# Patient Record
Sex: Male | Born: 1960 | Race: White | Hispanic: No | Marital: Single
Health system: Midwestern US, Academic
[De-identification: ages and names within clinical notes are randomized; demographics above are authoritative.]

---

## 2016-05-04 MED ORDER — PEG-ELECTROLYTE SOLN 420 GRAM PO SOLR
4 L | Freq: Once | ORAL | 0 refills | Status: AC
Start: 2016-05-04 — End: ?

## 2016-05-14 MED ORDER — LACTATED RINGERS IV SOLP
0 refills | Status: DC
Start: 2016-05-14 — End: 2016-05-14

## 2016-05-14 MED ORDER — PROPOFOL 10 MG/ML IV EMUL 20 ML (INFUSION)(AM)(OR)
INTRAVENOUS | 0 refills | Status: DC
Start: 2016-05-14 — End: 2016-05-14

## 2016-05-14 MED ORDER — FENTANYL CITRATE (PF) 50 MCG/ML IJ SOLN
50 ug | INTRAVENOUS | 0 refills | Status: CN | PRN
Start: 2016-05-14 — End: ?

## 2016-05-14 MED ORDER — LACTATED RINGERS IV SOLP
1000 mL | INTRAVENOUS | 0 refills | Status: DC
Start: 2016-05-14 — End: 2016-05-14

## 2016-05-14 MED ORDER — PROPOFOL INJ 10 MG/ML IV VIAL
0 refills | Status: DC
Start: 2016-05-14 — End: 2016-05-14

## 2016-05-14 MED ORDER — ONDANSETRON 8 MG PO TBDI
8 mg | Freq: Once | ORAL | 0 refills | Status: CN | PRN
Start: 2016-05-14 — End: ?

## 2016-05-14 MED ORDER — MIDAZOLAM 1 MG/ML IJ SOLN
INTRAVENOUS | 0 refills | Status: DC
Start: 2016-05-14 — End: 2016-05-14

## 2016-05-20 MED ORDER — SODIUM CHLORIDE 0.9 % IV SOLP
INTRAVENOUS | 0 refills | Status: CN
Start: 2016-05-20 — End: ?

## 2016-06-02 MED ORDER — SODIUM CHLORIDE 0.9 % IV SOLP
INTRAVENOUS | 0 refills | Status: CN
Start: 2016-06-02 — End: ?

## 2016-06-04 MED ORDER — PROPOFOL 10 MG/ML IV EMUL 20 ML (INFUSION)(AM)(OR)
INTRAVENOUS | 0 refills | Status: DC
Start: 2016-06-04 — End: 2016-06-04

## 2016-06-04 MED ORDER — LACTATED RINGERS IV SOLP
500 mL | INTRAVENOUS | 0 refills | Status: DC
Start: 2016-06-04 — End: 2016-06-04

## 2016-06-04 MED ORDER — LIDOCAINE (PF) 200 MG/10 ML (2 %) IJ SYRG
0 refills | Status: DC
Start: 2016-06-04 — End: 2016-06-04

## 2016-06-04 MED ORDER — KETAMINE 10 MG/ML IJ SOLN
0 refills | Status: DC
Start: 2016-06-04 — End: 2016-06-04

## 2016-06-04 MED ORDER — PROPOFOL INJ 10 MG/ML IV VIAL
0 refills | Status: DC
Start: 2016-06-04 — End: 2016-06-04

## 2016-09-23 ENCOUNTER — Encounter: Admit: 2016-09-23 | Discharge: 2016-09-23 | Payer: BC Managed Care – HMO

## 2016-09-23 ENCOUNTER — Ambulatory Visit: Admit: 2016-09-23 | Discharge: 2016-09-23 | Payer: BC Managed Care – HMO

## 2016-09-23 ENCOUNTER — Ambulatory Visit: Admit: 2016-09-23 | Discharge: 2016-09-24 | Payer: BC Managed Care – HMO

## 2016-09-23 DIAGNOSIS — R918 Other nonspecific abnormal finding of lung field: Principal | ICD-10-CM

## 2016-09-23 DIAGNOSIS — E042 Nontoxic multinodular goiter: ICD-10-CM

## 2016-09-23 DIAGNOSIS — C73 Malignant neoplasm of thyroid gland: Principal | ICD-10-CM

## 2016-09-23 DIAGNOSIS — Z87891 Personal history of nicotine dependence: ICD-10-CM

## 2016-09-23 DIAGNOSIS — D499 Neoplasm of unspecified behavior of unspecified site: ICD-10-CM

## 2016-09-23 DIAGNOSIS — I10 Essential (primary) hypertension: Secondary | ICD-10-CM

## 2016-09-23 DIAGNOSIS — R079 Chest pain, unspecified: ICD-10-CM

## 2016-09-23 DIAGNOSIS — H9319 Tinnitus, unspecified ear: ICD-10-CM

## 2016-09-23 DIAGNOSIS — E349 Endocrine disorder, unspecified: ICD-10-CM

## 2016-09-23 DIAGNOSIS — R938 Abnormal findings on diagnostic imaging of other specified body structures: Principal | ICD-10-CM

## 2016-09-23 DIAGNOSIS — Z923 Personal history of irradiation: ICD-10-CM

## 2016-09-23 DIAGNOSIS — F419 Anxiety disorder, unspecified: ICD-10-CM

## 2016-09-23 DIAGNOSIS — F1020 Alcohol dependence, uncomplicated: ICD-10-CM

## 2016-11-11 ENCOUNTER — Ambulatory Visit: Admit: 2016-11-11 | Discharge: 2016-11-11 | Payer: BC Managed Care – HMO

## 2016-11-11 ENCOUNTER — Encounter: Admit: 2016-11-11 | Discharge: 2016-11-11 | Payer: BC Managed Care – HMO

## 2016-11-11 DIAGNOSIS — R079 Chest pain, unspecified: ICD-10-CM

## 2016-11-11 DIAGNOSIS — C73 Malignant neoplasm of thyroid gland: Principal | ICD-10-CM

## 2016-11-11 DIAGNOSIS — E349 Endocrine disorder, unspecified: ICD-10-CM

## 2016-11-11 DIAGNOSIS — F1020 Alcohol dependence, uncomplicated: ICD-10-CM

## 2016-11-11 DIAGNOSIS — R110 Nausea: Principal | ICD-10-CM

## 2016-11-11 DIAGNOSIS — Z923 Personal history of irradiation: ICD-10-CM

## 2016-11-11 DIAGNOSIS — H9319 Tinnitus, unspecified ear: ICD-10-CM

## 2016-11-11 DIAGNOSIS — D499 Neoplasm of unspecified behavior of unspecified site: ICD-10-CM

## 2016-11-11 DIAGNOSIS — E042 Nontoxic multinodular goiter: ICD-10-CM

## 2016-11-11 DIAGNOSIS — I10 Essential (primary) hypertension: Secondary | ICD-10-CM

## 2016-11-11 DIAGNOSIS — F419 Anxiety disorder, unspecified: ICD-10-CM

## 2016-11-11 MED ORDER — NORTRIPTYLINE 25 MG PO CAP
25 mg | ORAL_CAPSULE | Freq: Every evening | ORAL | 5 refills | Status: AC
Start: 2016-11-11 — End: 2017-02-12

## 2016-11-13 ENCOUNTER — Ambulatory Visit: Admit: 2016-11-13 | Discharge: 2016-11-14 | Payer: BC Managed Care – HMO

## 2016-11-13 ENCOUNTER — Encounter: Admit: 2016-11-13 | Discharge: 2016-11-13 | Payer: BC Managed Care – HMO

## 2016-11-13 ENCOUNTER — Ambulatory Visit: Admit: 2016-11-13 | Discharge: 2016-11-13 | Payer: BC Managed Care – HMO

## 2016-11-13 DIAGNOSIS — E349 Endocrine disorder, unspecified: ICD-10-CM

## 2016-11-13 DIAGNOSIS — E785 Hyperlipidemia, unspecified: ICD-10-CM

## 2016-11-13 DIAGNOSIS — I10 Essential (primary) hypertension: ICD-10-CM

## 2016-11-13 DIAGNOSIS — F419 Anxiety disorder, unspecified: ICD-10-CM

## 2016-11-13 DIAGNOSIS — F1020 Alcohol dependence, uncomplicated: ICD-10-CM

## 2016-11-13 DIAGNOSIS — Z923 Personal history of irradiation: ICD-10-CM

## 2016-11-13 DIAGNOSIS — H9319 Tinnitus, unspecified ear: ICD-10-CM

## 2016-11-13 DIAGNOSIS — R918 Other nonspecific abnormal finding of lung field: ICD-10-CM

## 2016-11-13 DIAGNOSIS — C73 Malignant neoplasm of thyroid gland: Principal | ICD-10-CM

## 2016-11-13 DIAGNOSIS — R972 Elevated prostate specific antigen [PSA]: ICD-10-CM

## 2016-11-13 DIAGNOSIS — D499 Neoplasm of unspecified behavior of unspecified site: ICD-10-CM

## 2016-11-13 DIAGNOSIS — E890 Postprocedural hypothyroidism: ICD-10-CM

## 2016-11-13 DIAGNOSIS — Z720 Tobacco use: ICD-10-CM

## 2016-11-13 DIAGNOSIS — E042 Nontoxic multinodular goiter: ICD-10-CM

## 2016-11-13 DIAGNOSIS — R079 Chest pain, unspecified: ICD-10-CM

## 2016-11-13 LAB — COMPREHENSIVE METABOLIC PANEL
Lab: 1 mg/dL (ref 0.4–1.24)
Lab: 122 mg/dL — ABNORMAL HIGH (ref 70–100)
Lab: 137 MMOL/L (ref 137–147)
Lab: 18 mg/dL (ref 7–25)
Lab: 3.6 MMOL/L (ref 3.5–5.1)
Lab: 97 MMOL/L — ABNORMAL LOW (ref 98–110)

## 2016-11-13 LAB — PSA SCREEN: Lab: 0.8 ng/mL (ref <2.01)

## 2016-11-13 LAB — TSH WITH FREE T4 REFLEX: Lab: 2.8 uU/mL (ref 0.35–5.00)

## 2016-11-13 MED ORDER — TRAZODONE 50 MG PO TAB
50-100 mg | ORAL_TABLET | Freq: Every evening | ORAL | 3 refills | Status: AC
Start: 2016-11-13 — End: 2017-02-12

## 2016-11-13 MED ORDER — CHLORTHALIDONE 25 MG PO TAB
25 mg | ORAL_TABLET | Freq: Every day | ORAL | 3 refills | Status: AC
Start: 2016-11-13 — End: 2018-04-14

## 2016-11-13 MED ORDER — ATENOLOL 25 MG PO TAB
25 mg | ORAL_TABLET | Freq: Every day | ORAL | 3 refills | 33.00000 days | Status: AC
Start: 2016-11-13 — End: 2017-11-18

## 2016-11-13 NOTE — Progress Notes
Date of Service: 11/13/2016    Subjective:             Jeffrey Sanders is a 56 y.o. male.      Hypertension   Pertinent negatives include no chest pain, palpitations or shortness of breath.      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 Royce Macadamia and HTN presented for regular follow-up.  He has been tapered off of the Restoril and started on trazodone which has been helping him with his sleep.  Blood work has been reviewed patient denied having any other acute concerns.  Seen recently by gastroenterology and has been started on nortriptyline which she has not started taking.  He is appearing to be anxious.  He is not taking the Restoril and a longer however he is still taking the trazodone for sleep.  He was seen by endocrinology externally.  He has been taking atenolol and chlorthalidone for hypertension however those were not given by the office here and was wanting a local doctor might be getting him.  Denied having any other acute concerns were reviewed the medical records.  History of Cowden's Dz (hamartoma syndrome), FAP - Familial Adenomatous Polyposis, or MEN syndrome: No  History of neck radiation exposure: No   Endocrinologist: Jeffrey Sanders  Was also seen by pulmonary and had a CT scan for pulmonary nodules and follow-up in 12 months has been recommended no bronchodilators have been started based on his symptoms and also the PFTs.  Patient voiced understanding of all the recommendations denied having any other acute concerns at this point of time.  Past Medical History:   Diagnosis Date   ??? Alcoholism (HCC)    ??? Anxiety    ??? Chest pain 03/26/2016   ??? High blood pressure    ??? Hypertension 03/26/2016   ??? Multiple thyroid nodules 12/27/2008   ??? Neoplasm of unspecified nature, site unspecified    ??? Papillary carcinoma of thyroid (HCC) 12/07   ??? Ringing in the ears 12/27/2008 ??? S/P radioactive iodine thyroid ablation    ??? Unspecified endocrine disorder      Past Surgical History:   Procedure Laterality Date   ??? COLONOSCOPY N/A 05/14/2016    COLONOSCOPY performed by Jeffrey Nine, MD at [facility]   ??? COLONOSCOPY  05/14/2016    COLONOSCOPY EXCISION LESION performed by Jeffrey Nine, MD at [facility]   ??? UPPER GASTROINTESTINAL ENDOSCOPY N/A 06/04/2016    ESOPHAGOGASTRODUODENOSCOPY performed by Jeffrey Hoist, DO at [facility]   ??? UPPER GASTROINTESTINAL ENDOSCOPY  06/04/2016    ESOPHAGOGASTRODUODENOSCOPY BIOPSY performed by Jeffrey Hoist, DO at [facility]   ??? HX THYROIDECTOMY     ??? THYROIDECTOMY       Social History   ??? Marital status: Single     Spouse name: N/A   ??? Number of children: N/A   ??? Years of education: N/A     Occupational History   ??? 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 ???He does have a history of smoking of 3 packs a day, he quit a few years ago. ???He currently chews tobacco. Has a history of significant alcohol use, he used to drink 14 -15 beers a night. ???Patient tells me that currently he drinks 8-10 beers every night.       Social History Main Topics   ??? Smoking status: Former Smoker  Packs/day: 1.00     Years: 26.00     Types: Cigarettes     Quit date: 01/13/2008   ??? Smokeless tobacco: Current User     Types: Chew      Comment: Counseling given 1.31.13   ??? Alcohol use No   ??? Drug use: No   ??? Sexual activity: Not on file     Social history:     Review of Systems   Constitutional: Negative for activity change, appetite change, fatigue, fever and unexpected weight change.   HENT: Negative for dental problem, sore throat, tinnitus, trouble swallowing and voice change.    Eyes: Negative for visual disturbance.   Respiratory: Negative for shortness of breath.    Cardiovascular: Negative for chest pain, palpitations and leg swelling.   Gastrointestinal: Negative for abdominal pain.   Genitourinary: Negative for frequency.   Musculoskeletal: Negative for joint swelling. Neurological: Negative for dizziness.   Hematological: Negative for adenopathy.   Psychiatric/Behavioral: Negative for dysphoric mood and sleep disturbance. The patient is nervous/anxious.      Objective:         ??? aspirin 81 mg chewable tablet Chew 81 mg by mouth daily. Take with food.   ??? atenolol (TENORMIN) 25 mg tablet Take one tablet by mouth daily.   ??? atorvastatin (LIPITOR) 40 mg tablet Take 1 tablet by mouth daily.   ??? bi-est 50/50-progesterone-testosterone 1.25-5-0.125 mg/g topical cream Apply 1 g topically to affected area twice daily. *To inner thigh or upper inner arm   ??? chlorthalidone (HYGROTON) 25 mg tablet Take one tablet by mouth daily.   ??? levothyroxine (SYNTHROID) 100 mcg tablet Take 100 mcg by mouth daily 30 minutes before breakfast.   ??? nortriptyline (PAMELOR) 25 mg capsule Take one capsule by mouth at bedtime daily.   ??? omeprazole DR(+) (PRILOSEC) 20 mg capsule Take 20 mg by mouth daily before breakfast.   ??? traZODone (DESYREL) 50 mg tablet Take one tablet to two tablets by mouth at bedtime daily.     Vitals:    11/13/16 0935   BP: 132/83   Pulse: 75   Resp: 16   Temp: 36.7 ???C (98.1 ???F)   TempSrc: Oral   Weight: 72.8 kg (160 lb 9.6 oz)   Height: 170.2 cm (67)     Body mass index is 25.15 kg/m???.     Physical Exam  General Appearance: normal in appearance  Skin: warm, moist,   Eyes: conjunctivae mildly red and lids normal, pupils are equal and round  Lips & Oral Mucosa: no pallor  Neck Veins: neck veins are flat,  Respiratory Effort: breathing comfortably, no respiratory distress  Cardiac Rhythm: regular rhythm and normal rate  Lower Extremity Edema: no lower extremity edema  Abdominal Exam: soft, non-tender, no masses, bowel sounds normal  Neurologic Exam: neurological assessment grossly intact     Assessment and Plan:  History of T2, NX, MX papillary thyroid cancer ,left-followed closely by Endocrinology.  [place] FNA 06/2009 - No lesional tissue identified, Thyroglobulin AB <20. Non detectable last year Tgb recently 02/04/10. [place] L with stable2+cm remnant visible. Follwos with Zhou, [place] T and Tgb panel ,TSH lower normal recently.      Hyperlipidemia Management - On lipitor   Lab Results   Component Value Date/Time    CHOL 188 03/30/2016 10:25 AM    TRIG 172 (H) 03/30/2016 10:25 AM    HDL 41 03/30/2016 10:25 AM    LDL 123 (H) 03/30/2016 10:25 AM  VLDL 34 03/30/2016 10:25 AM    NONHDLCHOL 147 03/30/2016 10:25 AM    ALT 25 04/11/2016 11:12 AM    CK 41 03/19/2016        Imp: Hyperlipidemia fair     Plan:  Discussed labs and reviewed goals for LDL, HDL, triglycerides.   Discussed exercise management and diet with emphasis on vegetables, fruit and lean meat.  Are barriers to achieving goals present? No  Medication education provided. Patient voiced understanding? Yes  Patient able to self-manage and ready to comply?Yes  Educational resources identified? Verbal Counseling     Pulmonary nodules likely nonmetastatic however will continue to monitor in 12 months  - PPSV 23 given on 04/15/16, will need PSV when age >31  ???  Insomnia; doing well on trazodone    Screening for prostate cancer - PSA is mildly elevated and repeat lab is  pending  HIV screening is negative  Hep C is negative       Patient Instructions     Routine Clinic Information:     Please don't hesitate to call if you have any problems or questions. My nurse  can be reached at [phone].     You may also message [place] in MyChart.     For refills on medications, please have your pharmacy fax a refill authorization request form to our office at Fax) [phone].     Please allow at least 3 business days for refill requests.     For urgent issues after business hours/weekends/holidays call [phone] and request for the outpatient internal medicine physician to be paged     We offer same day appointments for your acute health concerns. These appointments are on a first come, first serve basis. Please call [phone] if you would like to make an appointment. If I am not available, you can see any of my partners or try to see me the next day (call at 8am).     See you in 6 months     Orders Placed This Encounter   ??? chlorthalidone (HYGROTON) 25 mg tablet   ??? atenolol (TENORMIN) 25 mg tablet   ??? traZODone (DESYREL) 50 mg tablet      please get the blood work done today     Take care  Dr.Aaleyah Witherow

## 2017-01-27 ENCOUNTER — Encounter: Admit: 2017-01-27 | Discharge: 2017-01-27 | Payer: BC Managed Care – HMO

## 2017-01-28 ENCOUNTER — Encounter: Admit: 2017-01-28 | Discharge: 2017-01-28 | Payer: BC Managed Care – HMO

## 2017-02-12 ENCOUNTER — Encounter: Admit: 2017-02-12 | Discharge: 2017-02-12 | Payer: BC Managed Care – HMO

## 2017-02-12 ENCOUNTER — Ambulatory Visit: Admit: 2017-02-12 | Discharge: 2017-02-12 | Payer: BC Managed Care – HMO

## 2017-02-12 DIAGNOSIS — F329 Major depressive disorder, single episode, unspecified: ICD-10-CM

## 2017-02-12 DIAGNOSIS — Z923 Personal history of irradiation: ICD-10-CM

## 2017-02-12 DIAGNOSIS — E890 Postprocedural hypothyroidism: ICD-10-CM

## 2017-02-12 DIAGNOSIS — E785 Hyperlipidemia, unspecified: ICD-10-CM

## 2017-02-12 DIAGNOSIS — D499 Neoplasm of unspecified behavior of unspecified site: ICD-10-CM

## 2017-02-12 DIAGNOSIS — F419 Anxiety disorder, unspecified: ICD-10-CM

## 2017-02-12 DIAGNOSIS — I10 Essential (primary) hypertension: Principal | ICD-10-CM

## 2017-02-12 DIAGNOSIS — E349 Endocrine disorder, unspecified: ICD-10-CM

## 2017-02-12 DIAGNOSIS — Z720 Tobacco use: ICD-10-CM

## 2017-02-12 DIAGNOSIS — F1020 Alcohol dependence, uncomplicated: ICD-10-CM

## 2017-02-12 DIAGNOSIS — C73 Malignant neoplasm of thyroid gland: ICD-10-CM

## 2017-02-12 DIAGNOSIS — E042 Nontoxic multinodular goiter: ICD-10-CM

## 2017-02-12 DIAGNOSIS — R079 Chest pain, unspecified: ICD-10-CM

## 2017-02-12 DIAGNOSIS — R918 Other nonspecific abnormal finding of lung field: ICD-10-CM

## 2017-02-12 DIAGNOSIS — H9319 Tinnitus, unspecified ear: ICD-10-CM

## 2017-02-12 MED ORDER — ESCITALOPRAM OXALATE 10 MG PO TAB
10 mg | ORAL_TABLET | Freq: Every day | ORAL | 0 refills | Status: AC
Start: 2017-02-12 — End: 2017-02-19

## 2017-02-18 ENCOUNTER — Encounter: Admit: 2017-02-18 | Discharge: 2017-02-18 | Payer: BC Managed Care – HMO

## 2017-02-19 MED ORDER — BUSPIRONE 30 MG PO TAB
30 mg | ORAL_TABLET | Freq: Every day | ORAL | 0 refills | Status: AC
Start: 2017-02-19 — End: 2017-03-12

## 2017-03-12 ENCOUNTER — Encounter: Admit: 2017-03-12 | Discharge: 2017-03-12 | Payer: BC Managed Care – HMO

## 2017-03-12 ENCOUNTER — Ambulatory Visit: Admit: 2017-03-12 | Discharge: 2017-03-12 | Payer: BC Managed Care – HMO

## 2017-03-12 DIAGNOSIS — Z923 Personal history of irradiation: ICD-10-CM

## 2017-03-12 DIAGNOSIS — I10 Essential (primary) hypertension: ICD-10-CM

## 2017-03-12 DIAGNOSIS — C73 Malignant neoplasm of thyroid gland: Principal | ICD-10-CM

## 2017-03-12 DIAGNOSIS — I208 Other forms of angina pectoris: ICD-10-CM

## 2017-03-12 DIAGNOSIS — H9319 Tinnitus, unspecified ear: ICD-10-CM

## 2017-03-12 DIAGNOSIS — E349 Endocrine disorder, unspecified: ICD-10-CM

## 2017-03-12 DIAGNOSIS — R079 Chest pain, unspecified: ICD-10-CM

## 2017-03-12 DIAGNOSIS — R918 Other nonspecific abnormal finding of lung field: Principal | ICD-10-CM

## 2017-03-12 DIAGNOSIS — D499 Neoplasm of unspecified behavior of unspecified site: ICD-10-CM

## 2017-03-12 DIAGNOSIS — F1020 Alcohol dependence, uncomplicated: ICD-10-CM

## 2017-03-12 DIAGNOSIS — F419 Anxiety disorder, unspecified: ICD-10-CM

## 2017-03-12 DIAGNOSIS — E042 Nontoxic multinodular goiter: ICD-10-CM

## 2017-03-12 DIAGNOSIS — E890 Postprocedural hypothyroidism: ICD-10-CM

## 2017-03-12 MED ORDER — BUSPIRONE 30 MG PO TAB
30 mg | ORAL_TABLET | Freq: Every day | ORAL | 3 refills | Status: AC
Start: 2017-03-12 — End: 2018-11-14

## 2017-03-12 MED ORDER — ATORVASTATIN 40 MG PO TAB
40 mg | ORAL_TABLET | Freq: Every day | ORAL | 3 refills | Status: AC
Start: 2017-03-12 — End: 2018-03-10

## 2017-04-15 ENCOUNTER — Encounter: Admit: 2017-04-15 | Discharge: 2017-04-15 | Payer: BC Managed Care – HMO

## 2017-04-15 DIAGNOSIS — R079 Chest pain, unspecified: ICD-10-CM

## 2017-04-15 DIAGNOSIS — E349 Endocrine disorder, unspecified: ICD-10-CM

## 2017-04-15 DIAGNOSIS — H9319 Tinnitus, unspecified ear: ICD-10-CM

## 2017-04-15 DIAGNOSIS — F419 Anxiety disorder, unspecified: ICD-10-CM

## 2017-04-15 DIAGNOSIS — D499 Neoplasm of unspecified behavior of unspecified site: ICD-10-CM

## 2017-04-15 DIAGNOSIS — F1020 Alcohol dependence, uncomplicated: ICD-10-CM

## 2017-04-15 DIAGNOSIS — E042 Nontoxic multinodular goiter: ICD-10-CM

## 2017-04-15 DIAGNOSIS — Z923 Personal history of irradiation: ICD-10-CM

## 2017-04-15 DIAGNOSIS — C73 Malignant neoplasm of thyroid gland: Principal | ICD-10-CM

## 2017-04-15 DIAGNOSIS — I10 Essential (primary) hypertension: ICD-10-CM

## 2017-09-06 ENCOUNTER — Ambulatory Visit: Admit: 2017-09-06 | Discharge: 2017-09-07 | Payer: BC Managed Care – HMO

## 2017-09-06 ENCOUNTER — Encounter: Admit: 2017-09-06 | Discharge: 2017-09-06 | Payer: BC Managed Care – HMO

## 2017-09-06 DIAGNOSIS — D499 Neoplasm of unspecified behavior of unspecified site: ICD-10-CM

## 2017-09-06 DIAGNOSIS — E349 Endocrine disorder, unspecified: ICD-10-CM

## 2017-09-06 DIAGNOSIS — F1020 Alcohol dependence, uncomplicated: ICD-10-CM

## 2017-09-06 DIAGNOSIS — I10 Essential (primary) hypertension: ICD-10-CM

## 2017-09-06 DIAGNOSIS — C73 Malignant neoplasm of thyroid gland: Principal | ICD-10-CM

## 2017-09-06 DIAGNOSIS — E042 Nontoxic multinodular goiter: ICD-10-CM

## 2017-09-06 DIAGNOSIS — H9319 Tinnitus, unspecified ear: ICD-10-CM

## 2017-09-06 DIAGNOSIS — R079 Chest pain, unspecified: ICD-10-CM

## 2017-09-06 DIAGNOSIS — Z923 Personal history of irradiation: ICD-10-CM

## 2017-09-06 DIAGNOSIS — F419 Anxiety disorder, unspecified: ICD-10-CM

## 2017-09-06 MED ORDER — VENLAFAXINE 37.5 MG PO CP24
37.5 mg | ORAL_CAPSULE | Freq: Every day | ORAL | 0 refills | Status: AC
Start: 2017-09-06 — End: 2017-10-04

## 2017-09-07 DIAGNOSIS — C73 Malignant neoplasm of thyroid gland: ICD-10-CM

## 2017-09-07 DIAGNOSIS — Z720 Tobacco use: ICD-10-CM

## 2017-09-07 DIAGNOSIS — I10 Essential (primary) hypertension: ICD-10-CM

## 2017-09-07 DIAGNOSIS — F329 Major depressive disorder, single episode, unspecified: ICD-10-CM

## 2017-09-07 DIAGNOSIS — F419 Anxiety disorder, unspecified: Principal | ICD-10-CM

## 2017-09-07 DIAGNOSIS — E785 Hyperlipidemia, unspecified: Secondary | ICD-10-CM

## 2017-09-07 DIAGNOSIS — E890 Postprocedural hypothyroidism: ICD-10-CM

## 2017-10-04 ENCOUNTER — Encounter: Admit: 2017-10-04 | Discharge: 2017-10-04 | Payer: BC Managed Care – HMO

## 2017-10-04 ENCOUNTER — Ambulatory Visit: Admit: 2017-10-04 | Discharge: 2017-10-05 | Payer: BC Managed Care – HMO

## 2017-10-04 DIAGNOSIS — C73 Malignant neoplasm of thyroid gland: Principal | ICD-10-CM

## 2017-10-04 DIAGNOSIS — I10 Essential (primary) hypertension: Secondary | ICD-10-CM

## 2017-10-04 DIAGNOSIS — F419 Anxiety disorder, unspecified: ICD-10-CM

## 2017-10-04 DIAGNOSIS — H9319 Tinnitus, unspecified ear: ICD-10-CM

## 2017-10-04 DIAGNOSIS — E349 Endocrine disorder, unspecified: ICD-10-CM

## 2017-10-04 DIAGNOSIS — Z923 Personal history of irradiation: ICD-10-CM

## 2017-10-04 DIAGNOSIS — E042 Nontoxic multinodular goiter: ICD-10-CM

## 2017-10-04 DIAGNOSIS — D499 Neoplasm of unspecified behavior of unspecified site: ICD-10-CM

## 2017-10-04 DIAGNOSIS — F1020 Alcohol dependence, uncomplicated: ICD-10-CM

## 2017-10-04 DIAGNOSIS — R918 Other nonspecific abnormal finding of lung field: Secondary | ICD-10-CM

## 2017-10-04 DIAGNOSIS — R079 Chest pain, unspecified: ICD-10-CM

## 2017-10-04 MED ORDER — VENLAFAXINE 75 MG PO CP24
75 mg | ORAL_CAPSULE | Freq: Every day | ORAL | 3 refills | Status: AC
Start: 2017-10-04 — End: 2018-09-08

## 2017-10-05 DIAGNOSIS — I10 Essential (primary) hypertension: Principal | ICD-10-CM

## 2017-10-05 DIAGNOSIS — E291 Testicular hypofunction: ICD-10-CM

## 2017-10-05 DIAGNOSIS — Z13220 Encounter for screening for lipoid disorders: ICD-10-CM

## 2017-10-05 DIAGNOSIS — F419 Anxiety disorder, unspecified: ICD-10-CM

## 2017-10-05 DIAGNOSIS — F329 Major depressive disorder, single episode, unspecified: ICD-10-CM

## 2017-10-05 DIAGNOSIS — R5383 Other fatigue: ICD-10-CM

## 2017-10-05 DIAGNOSIS — E785 Hyperlipidemia, unspecified: ICD-10-CM

## 2017-10-19 ENCOUNTER — Encounter: Admit: 2017-10-19 | Discharge: 2017-10-19 | Payer: BC Managed Care – HMO

## 2017-10-20 MED ORDER — LEVOTHYROXINE 100 MCG PO TAB
100 ug | ORAL_TABLET | Freq: Every day | ORAL | 3 refills | 30.00000 days | Status: AC
Start: 2017-10-20 — End: 2018-11-14

## 2017-10-27 ENCOUNTER — Encounter: Admit: 2017-10-27 | Discharge: 2017-10-27 | Payer: BC Managed Care – HMO

## 2017-10-28 MED ORDER — OTHER MEDICATION
RESPIRATORY_TRACT | 2 refills | 57.00000 days | Status: AC
Start: 2017-10-28 — End: 2018-02-11

## 2017-10-28 MED ORDER — TESTOSTERONE 50 MG/5 GRAM (1 %) TD GEL
1 | Freq: Every day | TRANSDERMAL | 2 refills | 28.00000 days | Status: AC
Start: 2017-10-28 — End: 2017-10-28

## 2017-11-18 ENCOUNTER — Encounter: Admit: 2017-11-18 | Discharge: 2017-11-18 | Payer: BC Managed Care – HMO

## 2017-11-18 MED ORDER — ATENOLOL 25 MG PO TAB
25 mg | ORAL_TABLET | Freq: Every day | ORAL | 1 refills | 33.00000 days | Status: AC
Start: 2017-11-18 — End: 2018-05-17

## 2018-02-11 ENCOUNTER — Encounter: Admit: 2018-02-11 | Discharge: 2018-02-11 | Payer: BC Managed Care – HMO

## 2018-02-11 MED ORDER — OTHER MEDICATION
1 | Freq: Every day | TOPICAL | 2 refills | 57.00000 days | Status: AC
Start: 2018-02-11 — End: 2018-06-08

## 2018-02-15 ENCOUNTER — Ambulatory Visit: Admit: 2018-02-15 | Discharge: 2018-02-15 | Payer: BC Managed Care – HMO

## 2018-02-15 DIAGNOSIS — E291 Testicular hypofunction: ICD-10-CM

## 2018-02-15 DIAGNOSIS — R5383 Other fatigue: Secondary | ICD-10-CM

## 2018-02-15 DIAGNOSIS — I10 Essential (primary) hypertension: ICD-10-CM

## 2018-02-15 DIAGNOSIS — E785 Hyperlipidemia, unspecified: ICD-10-CM

## 2018-02-15 DIAGNOSIS — F419 Anxiety disorder, unspecified: Principal | ICD-10-CM

## 2018-02-15 DIAGNOSIS — F329 Major depressive disorder, single episode, unspecified: ICD-10-CM

## 2018-02-15 LAB — COMPREHENSIVE METABOLIC PANEL
Lab: 0.8 mg/dL (ref 0.3–1.2)
Lab: 12 10*3/uL (ref 3–12)
Lab: 138 MMOL/L (ref 137–147)
Lab: 28 U/L (ref 7–56)
Lab: 29 MMOL/L (ref 21–30)
Lab: 29 U/L (ref 7–40)
Lab: 3.8 MMOL/L (ref 3.5–5.1)
Lab: 4.6 g/dL — ABNORMAL LOW (ref 3.5–5.0)
Lab: 66 U/L (ref 25–110)
Lab: 97 MMOL/L — ABNORMAL LOW (ref <200)
Lab: >60 mL/min (ref >60)
Lab: >60 mL/min (ref >60)

## 2018-02-15 LAB — CBC AND DIFF
Lab: 0 10*3/uL (ref 0–0.20)
Lab: 35 g/dL — ABNORMAL LOW (ref >40)
Lab: 4.6 M/UL (ref 4.4–5.5)
Lab: 5.2 10*3/uL (ref 4.5–11.0)

## 2018-02-15 LAB — LIPID PROFILE
Lab: 151 mg/dL — ABNORMAL HIGH (ref <100)
Lab: 156 mg/dL (ref 6.0–8.0)
Lab: 51 mg/dL — ABNORMAL HIGH (ref <150)

## 2018-02-15 LAB — TESTOSTERONE,TOTAL: Lab: 351 ng/dL (ref 270–1070)

## 2018-03-10 ENCOUNTER — Encounter: Admit: 2018-03-10 | Discharge: 2018-03-10 | Payer: BC Managed Care – HMO

## 2018-03-10 DIAGNOSIS — C73 Malignant neoplasm of thyroid gland: ICD-10-CM

## 2018-03-10 DIAGNOSIS — I208 Other forms of angina pectoris: Principal | ICD-10-CM

## 2018-03-10 MED ORDER — ATORVASTATIN 40 MG PO TAB
40 mg | ORAL_TABLET | Freq: Every day | ORAL | 1 refills | Status: AC
Start: 2018-03-10 — End: 2018-09-08

## 2018-04-14 ENCOUNTER — Encounter: Admit: 2018-04-14 | Discharge: 2018-04-14 | Payer: BC Managed Care – HMO

## 2018-04-14 MED ORDER — CHLORTHALIDONE 25 MG PO TAB
25 mg | ORAL_TABLET | Freq: Every day | ORAL | 3 refills | Status: DC
Start: 2018-04-14 — End: 2018-11-14

## 2018-05-16 ENCOUNTER — Encounter: Admit: 2018-05-16 | Discharge: 2018-05-16 | Payer: BC Managed Care – HMO

## 2018-05-17 ENCOUNTER — Encounter: Admit: 2018-05-17 | Discharge: 2018-05-17 | Payer: BC Managed Care – HMO

## 2018-05-17 ENCOUNTER — Ambulatory Visit: Admit: 2018-05-17 | Discharge: 2018-05-17 | Payer: BC Managed Care – HMO

## 2018-05-17 DIAGNOSIS — F1020 Alcohol dependence, uncomplicated: ICD-10-CM

## 2018-05-17 DIAGNOSIS — Z923 Personal history of irradiation: ICD-10-CM

## 2018-05-17 DIAGNOSIS — E349 Endocrine disorder, unspecified: ICD-10-CM

## 2018-05-17 DIAGNOSIS — R0602 Shortness of breath: ICD-10-CM

## 2018-05-17 DIAGNOSIS — R079 Chest pain, unspecified: ICD-10-CM

## 2018-05-17 DIAGNOSIS — H9319 Tinnitus, unspecified ear: ICD-10-CM

## 2018-05-17 DIAGNOSIS — D499 Neoplasm of unspecified behavior of unspecified site: ICD-10-CM

## 2018-05-17 DIAGNOSIS — I10 Essential (primary) hypertension: ICD-10-CM

## 2018-05-17 DIAGNOSIS — E785 Hyperlipidemia, unspecified: Principal | ICD-10-CM

## 2018-05-17 DIAGNOSIS — E042 Nontoxic multinodular goiter: ICD-10-CM

## 2018-05-17 DIAGNOSIS — E7800 Pure hypercholesterolemia, unspecified: ICD-10-CM

## 2018-05-17 DIAGNOSIS — C73 Malignant neoplasm of thyroid gland: ICD-10-CM

## 2018-05-17 DIAGNOSIS — F419 Anxiety disorder, unspecified: ICD-10-CM

## 2018-05-17 MED ORDER — ATENOLOL 25 MG PO TAB
25 mg | ORAL_TABLET | Freq: Every day | ORAL | 1 refills | 33.00000 days | Status: DC
Start: 2018-05-17 — End: 2018-11-14

## 2018-05-17 NOTE — Progress Notes
hyperlipidemia and that fully compliant with statin therapy.  Patient remained stable from a cardiac standpoint without any symptoms compatible with angina, arrhythmias or congestive heart failure.    Plan:    1.  I asked patient to continue all current medications  2.  I did encourage compliance with statin therapy and other medications  3.  Patient will be evaluated with a 2D echo Doppler study in approximately 2 to 3 months, we will follow-up on the results and will call with further recommendations  6.  Follow-up office visit in approximately 6 months.         Current Medications (including today's revisions)  ??? aspirin 81 mg chewable tablet Chew 81 mg by mouth daily. Take with food.   ??? atenolol (TENORMIN) 25 mg tablet Take one tablet by mouth daily.   ??? atorvastatin (LIPITOR) 40 mg tablet Take one tablet by mouth daily.   ??? busPIRone (BUSPAR) 30 mg tablet Take one tablet by mouth daily.   ??? chlorthalidone (HYGROTON) 25 mg tablet Take one tablet by mouth daily.   ??? levothyroxine (SYNTHROID) 100 mcg tablet Take one tablet by mouth daily 30 minutes before breakfast.   ??? levothyroxine (SYNTHROID) 125 mcg tablet Take 125 mcg by mouth daily 30 minutes before breakfast.   ??? omeprazole DR(+) (PRILOSEC) 20 mg capsule Take 20 mg by mouth daily before breakfast.   ??? other medication Apply one Dose topically to affected area daily. Medication Name & Strength: Testosterone Gel 2%  Apply 4 clicks ( ) topically daily   ??? venlafaxine XR (EFFEXOR XR) 75 mg capsule Take one capsule by mouth daily. Take with food.

## 2018-05-18 ENCOUNTER — Encounter: Admit: 2018-05-18 | Discharge: 2018-05-18 | Payer: BC Managed Care – HMO

## 2018-06-08 ENCOUNTER — Encounter: Admit: 2018-06-08 | Discharge: 2018-06-08 | Payer: BC Managed Care – HMO

## 2018-06-08 MED ORDER — OTHER MEDICATION
1 | Freq: Every day | TOPICAL | 2 refills | 57.00000 days | Status: DC
Start: 2018-06-08 — End: 2018-11-28

## 2018-06-09 ENCOUNTER — Encounter: Admit: 2018-06-09 | Discharge: 2018-06-09 | Payer: BC Managed Care – HMO

## 2018-07-22 ENCOUNTER — Encounter: Admit: 2018-07-22 | Discharge: 2018-07-22

## 2018-07-22 ENCOUNTER — Ambulatory Visit: Admit: 2018-07-22 | Discharge: 2018-07-23

## 2018-07-22 DIAGNOSIS — I10 Essential (primary) hypertension: Secondary | ICD-10-CM

## 2018-07-25 ENCOUNTER — Encounter: Admit: 2018-07-25 | Discharge: 2018-07-25

## 2018-07-25 NOTE — Telephone Encounter
-----   Message from Vertell Novak, MD sent at 07/23/2018  5:26 PM CDT -----  Dear Ottie Glazier call the patient to let him know that the echocardiogram is overall unremarkable, showed normal heart function.I think he does have an appointment with me later this year, he was last seen through a telehealth/video conference.Thank you  ----- Message -----  From: Dahlia Byes, MD  Sent: 07/22/2018   2:40 PM CDT  To: Vertell Novak, MD

## 2018-07-25 NOTE — Telephone Encounter
Left VM, reviewed echo results per [facility] and requested pt call with questions.

## 2018-09-08 ENCOUNTER — Encounter: Admit: 2018-09-08 | Discharge: 2018-09-08

## 2018-09-08 DIAGNOSIS — I208 Other forms of angina pectoris: Secondary | ICD-10-CM

## 2018-09-08 DIAGNOSIS — C73 Malignant neoplasm of thyroid gland: Secondary | ICD-10-CM

## 2018-09-08 MED ORDER — ATORVASTATIN 40 MG PO TAB
40 mg | ORAL_TABLET | Freq: Every day | ORAL | 0 refills | Status: DC
Start: 2018-09-08 — End: 2018-11-14

## 2018-09-08 MED ORDER — VENLAFAXINE 75 MG PO CP24
75 mg | ORAL_CAPSULE | Freq: Every day | ORAL | 0 refills | Status: DC
Start: 2018-09-08 — End: 2018-11-14

## 2018-09-08 NOTE — Telephone Encounter
Only sent 60 days. Sent patient mychart to make appt.

## 2018-10-13 ENCOUNTER — Encounter: Admit: 2018-10-13 | Discharge: 2018-10-13 | Payer: BC Managed Care – HMO

## 2018-11-14 ENCOUNTER — Encounter: Admit: 2018-11-14 | Discharge: 2018-11-14 | Payer: BC Managed Care – HMO

## 2018-11-14 DIAGNOSIS — C73 Malignant neoplasm of thyroid gland: Secondary | ICD-10-CM

## 2018-11-14 DIAGNOSIS — I208 Other forms of angina pectoris: Secondary | ICD-10-CM

## 2018-11-14 MED ORDER — VENLAFAXINE 75 MG PO CP24
75 mg | ORAL_CAPSULE | Freq: Every day | ORAL | 0 refills | Status: DC
Start: 2018-11-14 — End: 2018-12-26

## 2018-11-14 MED ORDER — BUSPIRONE 30 MG PO TAB
30 mg | ORAL_TABLET | Freq: Every day | ORAL | 0 refills | Status: DC
Start: 2018-11-14 — End: 2018-12-26

## 2018-11-14 MED ORDER — ATENOLOL 25 MG PO TAB
25 mg | ORAL_TABLET | Freq: Every day | ORAL | 0 refills | 33.00000 days | Status: DC
Start: 2018-11-14 — End: 2019-02-08

## 2018-11-14 MED ORDER — ATORVASTATIN 40 MG PO TAB
40 mg | ORAL_TABLET | Freq: Every day | ORAL | 0 refills | Status: DC
Start: 2018-11-14 — End: 2019-02-08

## 2018-11-14 MED ORDER — CHLORTHALIDONE 25 MG PO TAB
25 mg | ORAL_TABLET | Freq: Every day | ORAL | 0 refills | Status: DC
Start: 2018-11-14 — End: 2019-02-27

## 2018-11-14 MED ORDER — LEVOTHYROXINE 100 MCG PO TAB
100 ug | ORAL_TABLET | Freq: Every day | ORAL | 0 refills | 30.00000 days | Status: DC
Start: 2018-11-14 — End: 2018-12-26

## 2018-11-14 NOTE — Telephone Encounter
Spoke with patient that he needs an appointment. Scheduled for next month. Advised would send in refills one time.

## 2018-11-25 ENCOUNTER — Encounter: Admit: 2018-11-25 | Discharge: 2018-11-25 | Payer: BC Managed Care – HMO

## 2018-11-25 NOTE — Telephone Encounter
Refill request from [facility] for testosterone.   Last filled 06/08/2018 (#30x2)  Last office visit 09/06/2017, next office visit 12/26/2018  This medication is not under standing order protocol.  Routing to Dr. Campbell Stall for approval.    Patient does not have enough to last until upcoming appointment.

## 2018-11-28 MED ORDER — OTHER MEDICATION
1 | Freq: Every day | TOPICAL | 0 refills | 57.00000 days | Status: DC
Start: 2018-11-28 — End: 2019-02-01

## 2018-12-26 ENCOUNTER — Encounter: Admit: 2018-12-26 | Discharge: 2018-12-26 | Payer: BC Managed Care – HMO

## 2018-12-26 ENCOUNTER — Ambulatory Visit: Admit: 2018-12-26 | Discharge: 2018-12-27 | Payer: BC Managed Care – HMO

## 2018-12-26 DIAGNOSIS — I10 Essential (primary) hypertension: Secondary | ICD-10-CM

## 2018-12-26 DIAGNOSIS — Z720 Tobacco use: Secondary | ICD-10-CM

## 2018-12-26 DIAGNOSIS — E291 Testicular hypofunction: Secondary | ICD-10-CM

## 2018-12-26 DIAGNOSIS — F1010 Alcohol abuse, uncomplicated: Secondary | ICD-10-CM

## 2018-12-26 DIAGNOSIS — F1020 Alcohol dependence, uncomplicated: Secondary | ICD-10-CM

## 2018-12-26 DIAGNOSIS — C73 Malignant neoplasm of thyroid gland: Secondary | ICD-10-CM

## 2018-12-26 DIAGNOSIS — H9319 Tinnitus, unspecified ear: Secondary | ICD-10-CM

## 2018-12-26 DIAGNOSIS — R918 Other nonspecific abnormal finding of lung field: Secondary | ICD-10-CM

## 2018-12-26 DIAGNOSIS — E559 Vitamin D deficiency, unspecified: Secondary | ICD-10-CM

## 2018-12-26 DIAGNOSIS — E042 Nontoxic multinodular goiter: Secondary | ICD-10-CM

## 2018-12-26 DIAGNOSIS — F419 Anxiety disorder, unspecified: Secondary | ICD-10-CM

## 2018-12-26 DIAGNOSIS — R972 Elevated prostate specific antigen [PSA]: Secondary | ICD-10-CM

## 2018-12-26 DIAGNOSIS — D499 Neoplasm of unspecified behavior of unspecified site: Secondary | ICD-10-CM

## 2018-12-26 DIAGNOSIS — Z0000 Encounter for general adult medical examination without abnormal findings: Secondary | ICD-10-CM

## 2018-12-26 DIAGNOSIS — Z923 Personal history of irradiation: Secondary | ICD-10-CM

## 2018-12-26 DIAGNOSIS — F329 Major depressive disorder, single episode, unspecified: Secondary | ICD-10-CM

## 2018-12-26 DIAGNOSIS — R079 Chest pain, unspecified: Secondary | ICD-10-CM

## 2018-12-26 DIAGNOSIS — E349 Endocrine disorder, unspecified: Secondary | ICD-10-CM

## 2018-12-26 DIAGNOSIS — R7309 Other abnormal glucose: Secondary | ICD-10-CM

## 2018-12-26 MED ORDER — BUSPIRONE 30 MG PO TAB
30 mg | ORAL_TABLET | Freq: Every day | ORAL | 3 refills | Status: DC
Start: 2018-12-26 — End: 2018-12-26

## 2018-12-26 MED ORDER — LEVOTHYROXINE 125 MCG PO TAB
125 ug | ORAL_TABLET | Freq: Every day | ORAL | 3 refills | 30.00000 days | Status: DC
Start: 2018-12-26 — End: 2019-02-27

## 2018-12-26 MED ORDER — VENLAFAXINE 75 MG PO CP24
75 mg | ORAL_CAPSULE | Freq: Every day | ORAL | 3 refills | Status: DC
Start: 2018-12-26 — End: 2018-12-26

## 2018-12-26 MED ORDER — VENLAFAXINE 150 MG PO CP24
150 mg | ORAL_CAPSULE | Freq: Every day | ORAL | 3 refills | Status: AC
Start: 2018-12-26 — End: ?

## 2018-12-26 NOTE — Progress Notes
Date of Service: 12/26/2018    Subjective:             Jeffrey Sanders is a 58 y.o. male.  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 Royce Macadamia and HTN presented for  follow-up.  He has been on 75 mg of Zyrtec XR and we talked about any side effects and how beneficial it has been to be increased it to 150 mg.     Also has been getting busy at work and mention to me about the testosterone refills along with the chlorthalidone which she has been receiving from his local physician.No SI or depression. He was seen by endocrinology externally.He has been taking atenolol and chlorthalidone for hypertension however those were not given by our office in the past and would like to get them from here in future. Denied having any other acute concerns .  History of Cowden's Dz (hamartoma syndrome), FAP - Familial Adenomatous Polyposis, or MEN syndrome: No  History of neck radiation exposure: No   Endocrinologist: Janyth Contes  Was also seen by pulmonary and had a CT scan for pulmonary nodules and follow-up in 12 months has been recommended no bronchodilators have been started based on his symptoms and also the PFTs.  Patient voiced understanding of all the recommendations denied having any other acute concerns at this point of time.     10-12 beers per night.  He quit cold [place] in 2013 for 3 years and for the past 3 years again he has been drinking heavily.  He is not interested in going to the alcohol Anonymous or alcohol abuse program.  Medical History:   Diagnosis Date   ? Alcoholism (HCC)    ? Anxiety    ? Chest pain 03/26/2016   ? High blood pressure    ? Hypertension 03/26/2016   ? 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 Neoplasm of unspecified nature, site unspecified    ? 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 Ringing in the ears 12/27/2008   ? S/P radioactive iodine thyroid ablation ? Unspecified endocrine disorder      Surgical History:   Procedure Laterality Date   ? ANGIOGRAPHY CORONARY ARTERY WITH LEFT HEART CATHETERIZATION WITH VENTRICULOGRAPHY Left 03/30/2016    Performed by Laney Pastor, MD at [facility] CATH LAB   ? POSSIBLE PERCUTANEOUS CORONARY STENT PLACEMENT WITH ANGIOPLASTY N/A 03/30/2016    Performed by Laney Pastor, MD at [facility] CATH LAB   ? COLONOSCOPY N/A 05/14/2016    Performed by Eliott Nine, MD at [facility] ENDO   ? COLONOSCOPY EXCISION LESION  05/14/2016    Performed by Eliott Nine, MD at [facility] ENDO   ? ESOPHAGOGASTRODUODENOSCOPY N/A 06/04/2016    Performed by Tempie Hoist, DO at [facility] ENDO   ? ESOPHAGOGASTRODUODENOSCOPY BIOPSY  06/04/2016    Performed by Tempie Hoist, DO at [facility] ENDO   ? HX THYROIDECTOMY     ? THYROIDECTOMY       Social History   ? Marital status: Single     Spouse name: N/A   ? Number of children: N/A   ? Years of education: N/A     Occupational History   ? 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 ?He does have a history of smoking of 3 packs a day, he quit a few years ago. ?He  currently chews tobacco. Has a history of significant alcohol use, he used to drink 14 -15 beers a night. ?Patient tells me that currently he drinks 8-10 beers every night.       Social History Main Topics   ? Smoking status: Former Smoker     Packs/day: 1.00     Years: 26.00     Types: Cigarettes     Quit date: 01/13/2008   ? Smokeless tobacco: Current User     Types: Chew      Comment: Counseling given 1.31.13   ? Alcohol use No   ? Drug use: No   ? Sexual activity: Not on file          Review of Systems   Constitutional: Negative for activity change, appetite change, fatigue, fever and unexpected weight change.   HENT: Negative for dental problem, sore throat, tinnitus, trouble swallowing and voice change.    Eyes: Negative for visual disturbance.   Respiratory: Negative for shortness of breath.    Cardiovascular: Negative for chest pain, palpitations and leg swelling. Gastrointestinal: Negative for abdominal pain.   Genitourinary: Negative for difficulty urinating and frequency.   Musculoskeletal: Negative for arthralgias and joint swelling.   Neurological: Negative for dizziness and headaches.   Hematological: Negative for adenopathy.   Psychiatric/Behavioral: Negative for behavioral problems, decreased concentration, dysphoric mood and sleep disturbance. The patient is not nervous/anxious.      Objective:         ? aspirin 81 mg chewable tablet Chew 81 mg by mouth daily. Take with food.   ? atenoloL (TENORMIN) 25 mg tablet Take one tablet by mouth daily.   ? atorvastatin (LIPITOR) 40 mg tablet Take one tablet by mouth daily.   ? chlorthalidone (HYGROTON) 25 mg tablet Take one tablet by mouth daily.   ? levothyroxine (SYNTHROID) 125 mcg tablet Take one tablet by mouth daily 30 minutes before breakfast.   ? omeprazole DR(+) (PRILOSEC) 20 mg capsule Take 20 mg by mouth daily before breakfast.   ? other medication Apply one Dose topically to affected area daily. Medication Name & Strength: Testosterone Gel 2%  Apply 4 clicks ( ) topically daily   ? venlafaxine XR (EFFEXOR XR) 150 mg capsule Take one capsule by mouth daily. Take with food.     Vitals:    12/26/18 1345   BP: 135/86   Pulse: 94   Resp: 16   Weight: 69.9 kg (154 lb)   Height: 172.7 cm (68)   PainSc: Zero     Body mass index is 23.42 kg/m?Marland Kitchen     Physical Exam  General Appearance: normal in appearance  Skin: warm, moist,   Eyes: conjunctivae mildly red and  pupils are equal and round  Respiratory Effort: breathing comfortably, no respiratory distress  Cardiac Rhythm: regular rhythm and normal rate  Lower Extremity Edema: no lower extremity edema  Abdominal Exam: soft, non-tender, no masses, bowel sounds normal  Neurologic Exam: neurological assessment grossly intact   Psych: Mood appropriate.  Assessment and Plan: Anxiety; counseling has been provided and increased effexor xr 150 mg and follow up in 6 months .Lexapro and buspar has been tried .  He declined.  Third psychiatric and substance abuse counseling and appointments.    History of T2, NX, MX papillary thyroid cancer ,left-followed closely by Endocrinology.  [place] FNA 06/2009 - No lesional tissue identified, Thyroglobulin AB <20. Non detectable last year Tgb recently 02/04/10. [place] L with stable2+cm remnant visible. Follwos with Janyth Contes, [place]  T and Tgb panel ,TSH lower normal recently.On 125 mcg on synthroid.  TSH is being ordered as he has not been following with his other endocrinologist.    Hypertension Management: Continue on chlorthalidone and atenolol.  He is followed by local cardiologist  BP Readings from Last 3 Encounters:   12/26/18 135/86   07/22/18 132/78   05/17/18 128/78     Male hypogonadism; on testosterone gel replacement levels are being checked and future refills of the testosterone gel will be provided through our office.  Testosterone is not on the list of medications     Tobacco abuse; counseling has been provided.    Hyperlipidemia Management - On lipitor is not adequately controlled and he has not been very compliant with the medications of lipid profile is being rechecked  Lab Results   Component Value Date/Time    CHOL 244 (H) 02/15/2018 10:59 AM    TRIG 51 02/15/2018 10:59 AM    HDL 88 02/15/2018 10:59 AM    LDL 151 (H) 02/15/2018 10:59 AM    VLDL 10 02/15/2018 10:59 AM    NONHDLCHOL 156 02/15/2018 10:59 AM    ALT 28 02/15/2018 10:59 AM    CK 41 03/19/2016    VITD25 14.4 (L) 02/15/2018 10:59 AM        Imp: Hyperlipidemia fair     Plan:  Discussed labs and reviewed goals for LDL, HDL, triglycerides.   Discussed exercise management and diet with emphasis on vegetables, fruit and lean meat.  Are barriers to achieving goals present? No  Medication education provided. Patient voiced understanding? Yes  Patient able to self-manage and ready to comply?Yes Educational resources identified? Verbal Counseling     Pulmonary nodules likely nonmetastatic however will continue to monitor in 12 months however he skipped getting the CT scan in 2019 so it has been reordered and encouraged to get it done  - PPSV 23 given on 04/15/16, will need PSV when age >46  ?  Insomnia; he had self discontinued trazodone in the past and has not been taking it.  Sleep hygiene has been discussed.  Alcohol cessation has been reinforced     Screening for prostate cancer - PSA is being ordered.  HIV screening is negative  Hep C is negative         Patient Instructions     Routine Clinic Information:     Please don't hesitate to call if you have any problems or questions. Sam can be reached at [phone].     You may also message [place] in MyChart.     For refills on medications, please have your pharmacy fax a refill authorization request form to our office at Fax) [phone].     Please allow at least 3 business days for refill requests.     For urgent issues after business hours/weekends/holidays call [phone] and request for the outpatient internal medicine physician to be paged .    We offer same day appointments for your acute health concerns. These appointments are on a first come, first serve basis.     Please call [phone] if you would like to make an appointment. If I am not available, you can see any of my partners or try to see me the next day (call at 8am).     See you in  6 months     Orders Placed This Encounter   ? CT CHEST WO CONTRAST   ? HEMOGLOBIN A1C   ? TSH WITH FREE T4  REFLEX   ? PSA SCREEN   ? 25-OH VITAMIN D (D2 + D3)   ? LIPID PROFILE   ? levothyroxine (SYNTHROID) 125 mcg tablet   ? venlafaxine XR (EFFEXOR XR) 150 mg capsule     Take care,    Dr.Glendora Clouatre & Sam

## 2018-12-27 DIAGNOSIS — Z125 Encounter for screening for malignant neoplasm of prostate: Secondary | ICD-10-CM

## 2018-12-27 DIAGNOSIS — E890 Postprocedural hypothyroidism: Secondary | ICD-10-CM

## 2018-12-27 DIAGNOSIS — E785 Hyperlipidemia, unspecified: Secondary | ICD-10-CM

## 2019-01-18 NOTE — Telephone Encounter
LVM with radiology scheduling number [phone] to schedule imaging exam

## 2019-01-31 ENCOUNTER — Encounter: Admit: 2019-01-31 | Discharge: 2019-01-31 | Payer: BC Managed Care – HMO

## 2019-02-01 MED ORDER — OTHER MEDICATION
1 | Freq: Every day | TOPICAL | 3 refills | 57.00000 days | Status: DC
Start: 2019-02-01 — End: 2019-05-24

## 2019-02-02 ENCOUNTER — Encounter: Admit: 2019-02-02 | Discharge: 2019-02-02 | Payer: BC Managed Care – HMO

## 2019-02-08 ENCOUNTER — Encounter: Admit: 2019-02-08 | Discharge: 2019-02-08 | Payer: BC Managed Care – HMO

## 2019-02-08 DIAGNOSIS — I208 Other forms of angina pectoris: Secondary | ICD-10-CM

## 2019-02-08 DIAGNOSIS — C73 Malignant neoplasm of thyroid gland: Secondary | ICD-10-CM

## 2019-02-08 MED ORDER — ATENOLOL 25 MG PO TAB
25 mg | ORAL_TABLET | Freq: Every day | ORAL | 0 refills | 33.00000 days | Status: DC
Start: 2019-02-08 — End: 2019-05-09

## 2019-02-08 MED ORDER — ATORVASTATIN 40 MG PO TAB
40 mg | ORAL_TABLET | Freq: Every day | ORAL | 0 refills | Status: DC
Start: 2019-02-08 — End: 2019-05-09

## 2019-02-08 NOTE — Telephone Encounter
Refill request for Atorvastatin 40 mg and Atenolol 25 mg   LOV: 12/26/18   Last refill: 11/14/18 (#90 for both)  Last ALT 28 on 02/15/18    Medication is on standing orders. Refilled per [facility] protocol.    Nate    [place]

## 2019-02-10 ENCOUNTER — Encounter: Admit: 2019-02-10 | Discharge: 2019-02-10 | Payer: BC Managed Care – HMO

## 2019-02-14 ENCOUNTER — Encounter: Admit: 2019-02-14 | Discharge: 2019-02-14 | Payer: BC Managed Care – HMO

## 2019-02-14 ENCOUNTER — Ambulatory Visit: Admit: 2019-02-14 | Discharge: 2019-02-14 | Payer: BC Managed Care – HMO

## 2019-02-14 DIAGNOSIS — R918 Other nonspecific abnormal finding of lung field: Secondary | ICD-10-CM

## 2019-02-16 ENCOUNTER — Encounter: Admit: 2019-02-16 | Discharge: 2019-02-16 | Payer: BC Managed Care – HMO

## 2019-02-16 DIAGNOSIS — R911 Solitary pulmonary nodule: Secondary | ICD-10-CM

## 2019-02-17 ENCOUNTER — Encounter: Admit: 2019-02-17 | Discharge: 2019-02-17 | Payer: BC Managed Care – HMO

## 2019-02-27 ENCOUNTER — Encounter: Admit: 2019-02-27 | Discharge: 2019-02-27 | Payer: BC Managed Care – HMO

## 2019-02-27 MED ORDER — CHLORTHALIDONE 25 MG PO TAB
25 mg | ORAL_TABLET | Freq: Every day | ORAL | 0 refills | Status: DC
Start: 2019-02-27 — End: 2019-05-09

## 2019-02-27 MED ORDER — LEVOTHYROXINE 125 MCG PO TAB
125 ug | ORAL_TABLET | Freq: Every day | ORAL | 0 refills | 30.00000 days | Status: DC
Start: 2019-02-27 — End: 2019-05-09

## 2019-02-27 NOTE — Telephone Encounter
TSH   Lab Results   Component Value Date/Time    TSH 2.850 11/13/2016 10:18 AM        Last TSH is over 57 years old. Routing to Dr. Campbell Stall.    Refill request from [facility] for chlorthalidone.   Last filled 11/14/2018 (#90x0)  Last office visit 12/26/2018, next office visit DUE 06/2019.  This medication is not under standing order protocol.  Routing to Dr. Campbell Stall for approval.

## 2019-02-27 NOTE — Telephone Encounter
30 day supply provided.Thank you .

## 2019-05-03 MED ORDER — ATENOLOL 25 MG PO TAB
25 mg | ORAL_TABLET | Freq: Every day | ORAL | 0 refills
Start: 2019-05-03 — End: ?

## 2019-05-03 MED ORDER — CHLORTHALIDONE 25 MG PO TAB
25 mg | ORAL_TABLET | Freq: Every day | ORAL | 0 refills
Start: 2019-05-03 — End: ?

## 2019-05-03 MED ORDER — LEVOTHYROXINE 125 MCG PO TAB
125 ug | ORAL_TABLET | Freq: Every day | ORAL | 0 refills
Start: 2019-05-03 — End: ?

## 2019-05-03 MED ORDER — ATORVASTATIN 40 MG PO TAB
40 mg | ORAL_TABLET | Freq: Every day | ORAL | 0 refills
Start: 2019-05-03 — End: ?

## 2019-05-09 MED ORDER — CHLORTHALIDONE 25 MG PO TAB
25 mg | ORAL_TABLET | Freq: Every day | ORAL | 0 refills | Status: DC
Start: 2019-05-09 — End: 2019-05-18

## 2019-05-09 MED ORDER — ATENOLOL 25 MG PO TAB
25 mg | ORAL_TABLET | Freq: Every day | ORAL | 0 refills | 33.00000 days | Status: DC
Start: 2019-05-09 — End: 2019-05-18

## 2019-05-09 MED ORDER — ATORVASTATIN 40 MG PO TAB
40 mg | ORAL_TABLET | Freq: Every day | ORAL | 0 refills | Status: DC
Start: 2019-05-09 — End: 2019-05-18

## 2019-05-09 MED ORDER — LEVOTHYROXINE 125 MCG PO TAB
125 ug | ORAL_TABLET | Freq: Every day | ORAL | 0 refills | 30.00000 days | Status: DC
Start: 2019-05-09 — End: 2019-05-18

## 2019-05-17 ENCOUNTER — Encounter: Admit: 2019-05-17 | Discharge: 2019-05-17 | Payer: BC Managed Care – HMO

## 2019-05-17 DIAGNOSIS — R7309 Other abnormal glucose: Secondary | ICD-10-CM

## 2019-05-17 DIAGNOSIS — E785 Hyperlipidemia, unspecified: Secondary | ICD-10-CM

## 2019-05-17 DIAGNOSIS — Z125 Encounter for screening for malignant neoplasm of prostate: Secondary | ICD-10-CM

## 2019-05-17 DIAGNOSIS — E559 Vitamin D deficiency, unspecified: Secondary | ICD-10-CM

## 2019-05-17 DIAGNOSIS — E890 Postprocedural hypothyroidism: Secondary | ICD-10-CM

## 2019-05-18 ENCOUNTER — Encounter: Admit: 2019-05-18 | Discharge: 2019-05-18 | Payer: BC Managed Care – HMO

## 2019-05-18 MED ORDER — LEVOTHYROXINE 125 MCG PO TAB
125 ug | ORAL_TABLET | Freq: Every day | ORAL | 1 refills | 30.00000 days | Status: AC
Start: 2019-05-18 — End: ?

## 2019-05-18 MED ORDER — ATORVASTATIN 40 MG PO TAB
40 mg | ORAL_TABLET | Freq: Every day | ORAL | 1 refills | Status: AC
Start: 2019-05-18 — End: ?

## 2019-05-18 MED ORDER — ATENOLOL 25 MG PO TAB
25 mg | ORAL_TABLET | Freq: Every day | ORAL | 1 refills | 33.00000 days | Status: AC
Start: 2019-05-18 — End: ?

## 2019-05-18 MED ORDER — CHLORTHALIDONE 25 MG PO TAB
25 mg | ORAL_TABLET | Freq: Every day | ORAL | 1 refills | Status: AC
Start: 2019-05-18 — End: ?

## 2019-05-18 NOTE — Telephone Encounter
Refill request from patient for chlorthalidone.   Last filled 05/09/2019 (#30x0)  Last office visit 12/26/2018, next office visit due June 2021  This medication is not under standing order protocol.  Routing to Dr. Renard Matter for approval.

## 2019-05-24 ENCOUNTER — Encounter: Admit: 2019-05-24 | Discharge: 2019-05-24 | Payer: BC Managed Care – HMO

## 2019-05-24 MED ORDER — OTHER MEDICATION
1 | Freq: Every day | TOPICAL | 3 refills | 57.00000 days | Status: AC
Start: 2019-05-24 — End: ?

## 2019-05-24 NOTE — Telephone Encounter
Refill request from pharmacy for testosterone.   Last filled 02/01/2019 (#30x3)  Last office visit 12/26/2018, next office visit Due 06/2019.  This medication is not under standing order protocol.  Routing to Dr. Renard Matter for approval.

## 2019-05-24 NOTE — Telephone Encounter
Called in to patient's pharmacy. 

## 2019-12-04 ENCOUNTER — Encounter: Admit: 2019-12-04 | Discharge: 2019-12-04 | Payer: BC Managed Care – HMO

## 2019-12-04 DIAGNOSIS — I208 Other forms of angina pectoris: Secondary | ICD-10-CM

## 2019-12-04 DIAGNOSIS — C73 Malignant neoplasm of thyroid gland: Secondary | ICD-10-CM

## 2019-12-04 MED ORDER — LEVOTHYROXINE 125 MCG PO TAB
125 ug | ORAL_TABLET | Freq: Every day | ORAL | 1 refills | 30.00000 days | Status: AC
Start: 2019-12-04 — End: ?

## 2019-12-04 MED ORDER — ATENOLOL 25 MG PO TAB
25 mg | ORAL_TABLET | Freq: Every day | ORAL | 1 refills | 33.00000 days | Status: AC
Start: 2019-12-04 — End: ?

## 2019-12-04 MED ORDER — CHLORTHALIDONE 25 MG PO TAB
25 mg | ORAL_TABLET | Freq: Every day | ORAL | 1 refills | Status: AC
Start: 2019-12-04 — End: ?

## 2019-12-04 MED ORDER — ATORVASTATIN 40 MG PO TAB
40 mg | ORAL_TABLET | Freq: Every day | ORAL | 1 refills | Status: AC
Start: 2019-12-04 — End: ?

## 2019-12-04 MED ORDER — VENLAFAXINE 150 MG PO CP24
150 mg | ORAL_CAPSULE | Freq: Every day | ORAL | 1 refills | Status: AC
Start: 2019-12-04 — End: ?

## 2019-12-04 NOTE — Telephone Encounter
Refill request from [facility] for chlortalidone.   Last filled 05/18/2019 (#90x1)  Last office visit 12/26/2018, next office visit none  This medication is not under standing order protocol.  Routing to Dr. Vennie Homans for approval.      Hepatic Function    Lab Results   Component Value Date/Time    ALBUMIN 4.6 02/15/2018 10:59 AM    TOTPROT 7.5 02/15/2018 10:59 AM    ALKPHOS 66 02/15/2018 10:59 AM    Lab Results   Component Value Date/Time    AST 29 02/15/2018 10:59 AM    ALT 28 02/15/2018 10:59 AM    TOTBILI 0.8 02/15/2018 10:59 AM        TSH   Lab Results   Component Value Date/Time    TSH 0.776 05/15/2019 12:00 AM        Refilled Atenolol, Atorvastatin, Levothyroxine, venlafaxine per [facility] Protocol.

## 2019-12-05 ENCOUNTER — Encounter: Admit: 2019-12-05 | Discharge: 2019-12-05 | Payer: BC Managed Care – HMO

## 2019-12-05 MED ORDER — OTHER MEDICATION
1 | Freq: Every day | TOPICAL | 5 refills | 57.00000 days | Status: AC
Start: 2019-12-05 — End: ?

## 2019-12-05 NOTE — Telephone Encounter
Refill request from [facility] for testosterone.   Last filled 05/24/19 (#30x3)  Last office visit 12/26/2018, next office visit none  This medication is not under standing order protocol.  Routing to Dr. Campbell Stall for approval.

## 2020-01-09 IMAGING — US ECHOCOMPL
1 series · 13 of 24 positions shown · non-contrast
Comparison: none

[Series 1: us echo 2d, wo/w m-mode, compl · 73 acquisitions, 13 frames shown]
[im 1/73]
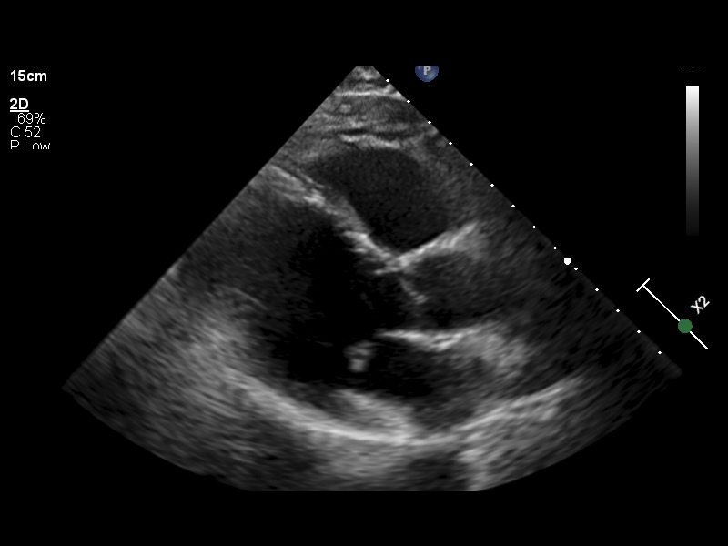
[im 4/73]
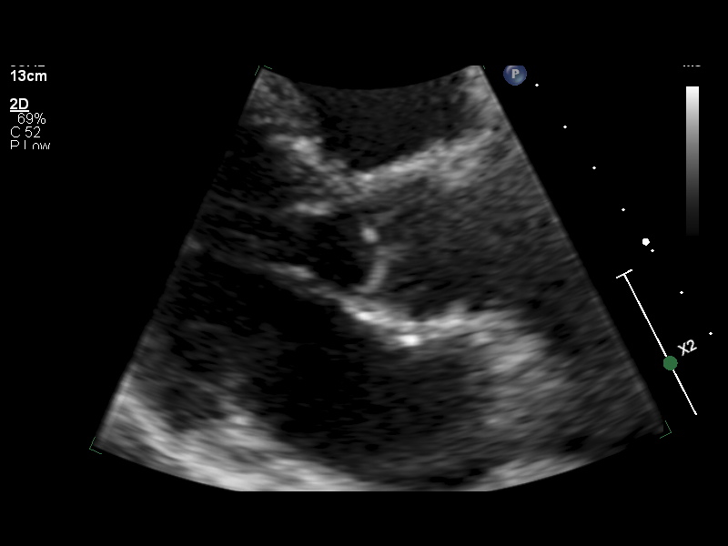
[im 10/73]
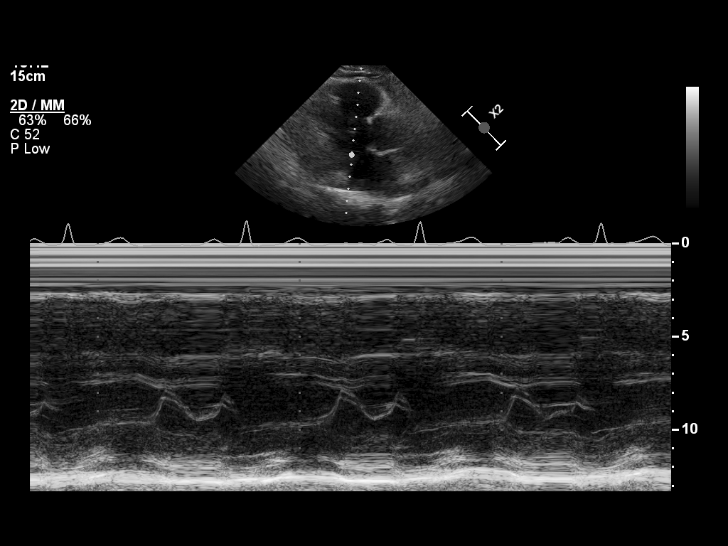
[im 16/73]
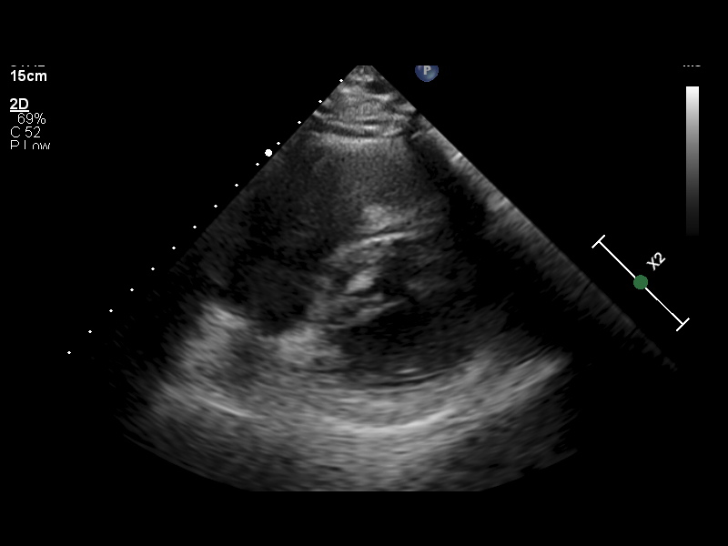
[im 26/73]
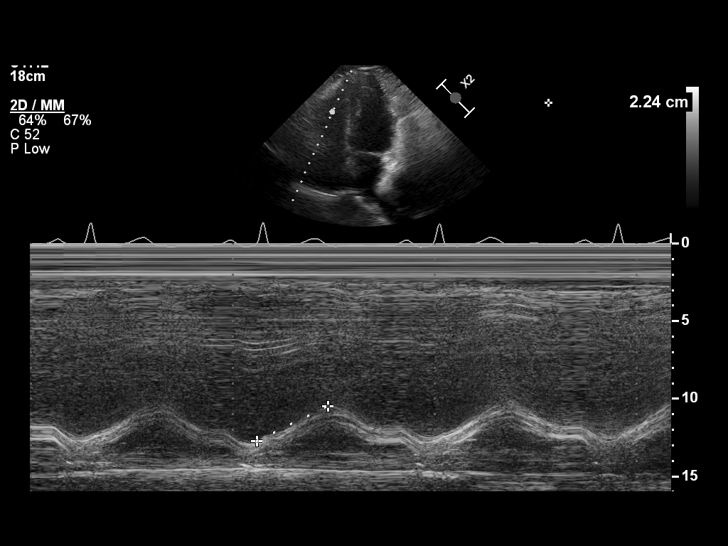
[im 32/73]
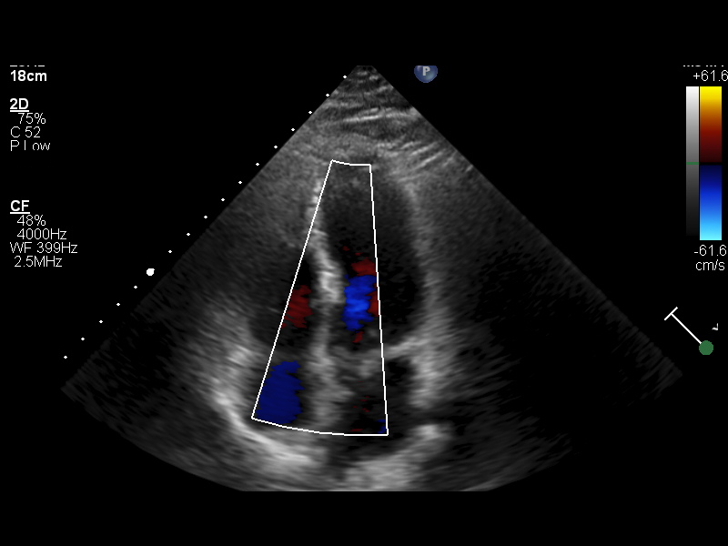
[im 38/73]
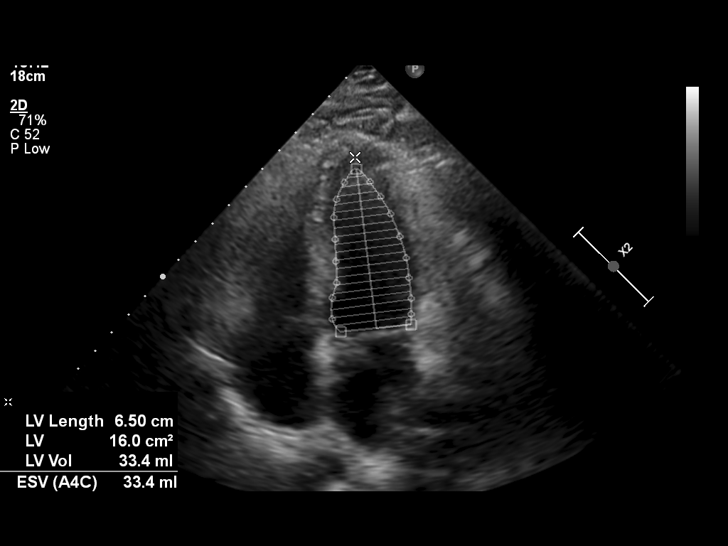
[im 41/73]
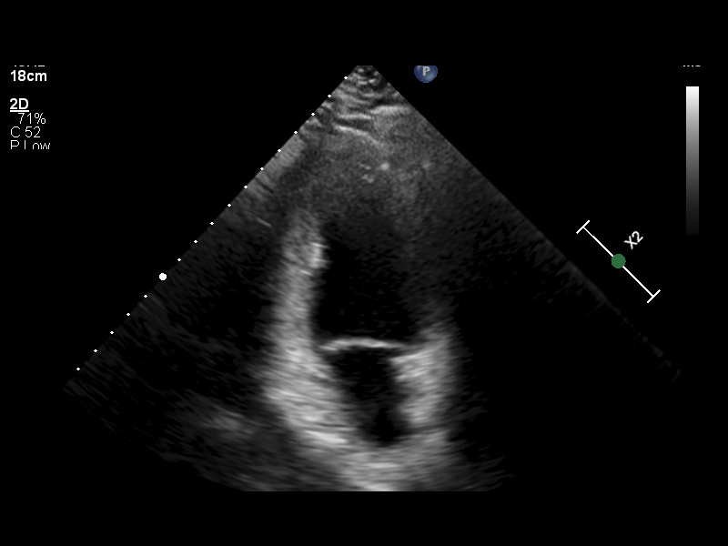
[im 47/73]
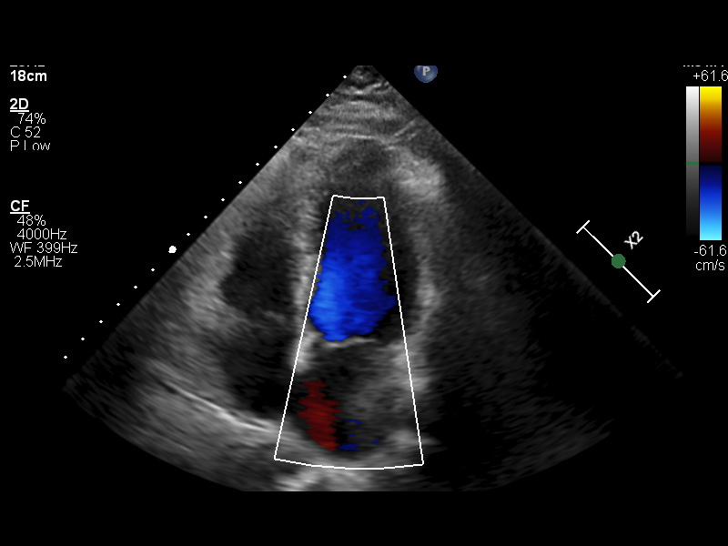
[im 54/73]
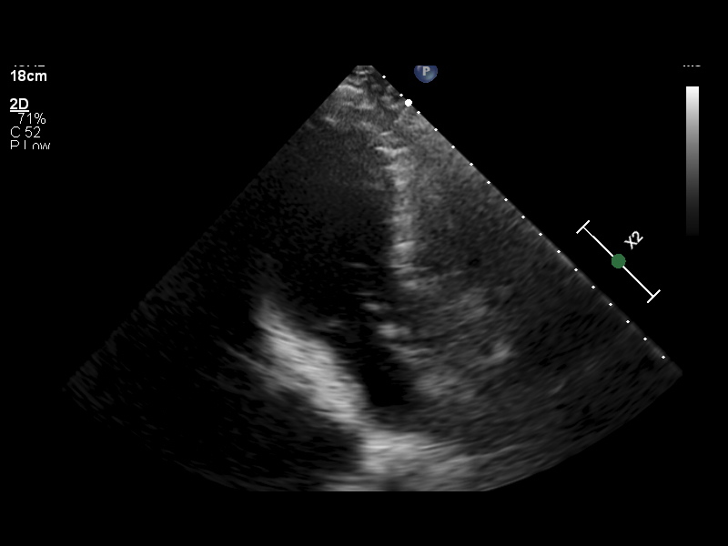
[im 60/73]
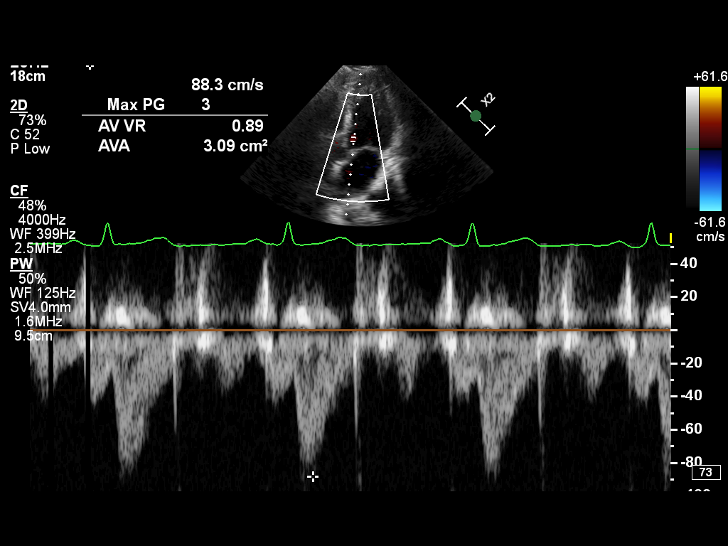
[im 66/73]
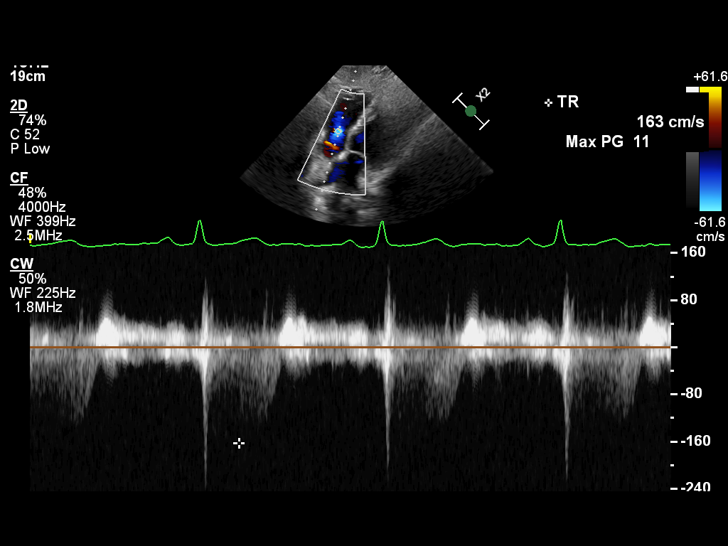
[im 73/73]
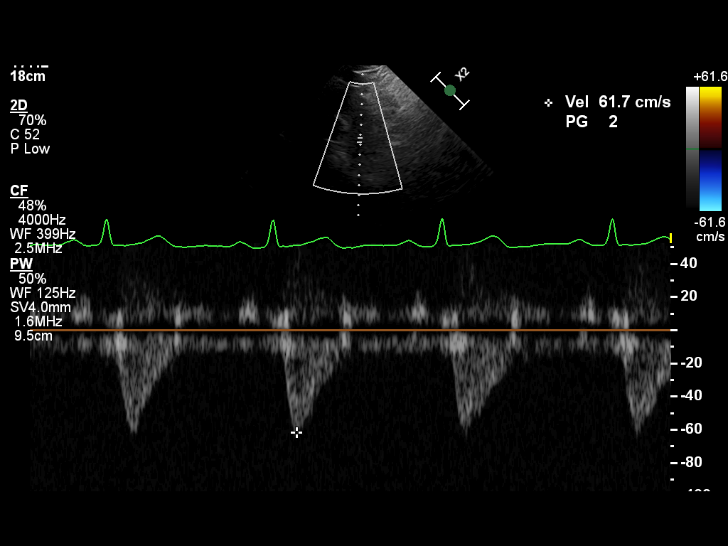

[13 of 24 positions shown; findings below may reference images not displayed]

2D + DOPPLER ECHO
Location Performed: [HOSPITAL]

Referring Provider: Gedeus, Wladimir Antonio
Klpigbb:
Location of Interp:

Indications:      Hypertension

Vitals
Height   Weight   BSA (Calculated)   BP   Comments
172.7 cm (68")   70.8 kg (156 lb)   1.84   132/78

Interpretation Summary
The left ventricle is normal in size and function, estimated ejection fraction is 57%.  Normal
diastolic function.
The right ventricle is normal in size and function.
The left and right atria are normal in size.
There are no significant valvular abnormalities.
Inadequate tricuspid regurgitation signal, unable to accurately estimate PA systolic pressure with
this study.

No prior studies are available for comparison.

Echocardiographic Findings
Left Ventricle   Normal size, wall thickness and shape. Normal ejection fraction. Normal septal
motion. Normal fractional shortening. Normal left ventricular diastolic function. Normal left atrial
pressure.
Right Ventricle   Normal size and ejection fraction.
Left Atrium   Normal size.
Right Atrium   Normal size.
IVC/SVC   Normal central venous pressure (0-5 mm Hg).
Mitral Valve   Normal valve structure. No stenosis. Trace regurgitation.
Tricuspid Valve   Normal valve structure. No stenosis. Trace regurgitation.
Aortic Valve   Normal valve structure. No stenosis. No regurgitation.
Pulmonary   The pulmonic valve was not seen well but no Doppler evidence of stenosis. No
regurgitation.
Aorta   Normal aorta.
Pericardium   No pericardial effusion.

Left Heart 2D Measurements (Normal Ranges)
EF
57 %
LVIDD
4.4 cm (Range: 4.2 - 5.8)
LVIDS
2.6 cm (Range: 2.5 - 4.0)
IVS
0.6 cm (Range: 0.6 - 1.0)
LV PW
0.7 cm (Range: 0.6 - 1.0)
LA Size
3.3 cm (Range: 3.0 - 4.0)

Right Heart 2D   M-Mode Measurements (Normal Ranges) (Range)
RV Basal Dia
3.5 cm (2.5 - 4.1)
RV Mid Dia
3.4 cm (1.9 - 3.5)
IVAN BENEDIKT
14.0 cm2 (<18)
M-Mode TAPSE
2.2 cm (>1.7)

Left Heart 2D Addnl Measurements (Normal Ranges)
LV Systolic Vol
33.0 mL (Range: 21 - 61)
LV Systolic Vol Index
17.93 mL (Range: 11 - 31)
LV Diastolic Vol
78.0 mL (Range: 62 - 150)
LV Diastolic Vol Index
42.39 mL (Range: 34 - 74)
LA Vol
34.0 mL (Range: 18 - 58)
LA Vol Index
18.48 (Range: 16 - 34)
LV Mass
83.81 g (Range: 88 - 224)
LV Mass Index
45.55 g/m2 (Range: 49 - 115)
RWT
0.32 (Range: <=0.42)

Aortic Root Measurements (Normal Ranges)
Sinus
3.3 cm (Range: 2.8 - 4.0)

Doppler (Spectral and Color Flow)
Estimated Peak Systolic PA Pressure
n/a mmHg
Aortic valve peak velocity
1.1 m/s

Tech Notes:

jl

## 2020-01-19 ENCOUNTER — Encounter: Admit: 2020-01-19 | Discharge: 2020-01-19 | Payer: BC Managed Care – HMO

## 2020-01-24 ENCOUNTER — Encounter: Admit: 2020-01-24 | Discharge: 2020-01-24 | Payer: BC Managed Care – HMO

## 2020-01-25 ENCOUNTER — Ambulatory Visit: Admit: 2020-01-25 | Discharge: 2020-01-26 | Payer: BC Managed Care – HMO

## 2020-01-25 ENCOUNTER — Encounter: Admit: 2020-01-25 | Discharge: 2020-01-25 | Payer: BC Managed Care – HMO

## 2020-01-25 DIAGNOSIS — C73 Malignant neoplasm of thyroid gland: Secondary | ICD-10-CM

## 2020-01-25 DIAGNOSIS — Z0000 Encounter for general adult medical examination without abnormal findings: Secondary | ICD-10-CM

## 2020-01-25 DIAGNOSIS — F1020 Alcohol dependence, uncomplicated: Secondary | ICD-10-CM

## 2020-01-25 DIAGNOSIS — H9319 Tinnitus, unspecified ear: Secondary | ICD-10-CM

## 2020-01-25 DIAGNOSIS — Z23 Encounter for immunization: Secondary | ICD-10-CM

## 2020-01-25 DIAGNOSIS — Z923 Personal history of irradiation: Secondary | ICD-10-CM

## 2020-01-25 DIAGNOSIS — R079 Chest pain, unspecified: Secondary | ICD-10-CM

## 2020-01-25 DIAGNOSIS — I10 Essential (primary) hypertension: Secondary | ICD-10-CM

## 2020-01-25 DIAGNOSIS — E042 Nontoxic multinodular goiter: Secondary | ICD-10-CM

## 2020-01-25 DIAGNOSIS — Z125 Encounter for screening for malignant neoplasm of prostate: Secondary | ICD-10-CM

## 2020-01-25 DIAGNOSIS — E785 Hyperlipidemia, unspecified: Secondary | ICD-10-CM

## 2020-01-25 DIAGNOSIS — E349 Endocrine disorder, unspecified: Secondary | ICD-10-CM

## 2020-01-25 DIAGNOSIS — F419 Anxiety disorder, unspecified: Secondary | ICD-10-CM

## 2020-01-25 DIAGNOSIS — I208 Other forms of angina pectoris: Secondary | ICD-10-CM

## 2020-01-25 DIAGNOSIS — D499 Neoplasm of unspecified behavior of unspecified site: Secondary | ICD-10-CM

## 2020-01-25 MED ORDER — LEVOTHYROXINE 125 MCG PO TAB
125 ug | ORAL_TABLET | Freq: Every day | ORAL | 1 refills | 30.00000 days | Status: AC
Start: 2020-01-25 — End: ?

## 2020-01-25 MED ORDER — VENLAFAXINE 150 MG PO CP24
150 mg | ORAL_CAPSULE | Freq: Every day | ORAL | 1 refills | Status: AC
Start: 2020-01-25 — End: ?

## 2020-01-25 NOTE — Progress Notes
Patient presents for first dose Pfizer COVID-19 vaccination.  Patient verified name and date of birth.  Consent signed for this vaccination.  Patient gave verbal consent for this RN to administer.  Injected 0.3 mL into patient's right deltoid.  Patient tolerated well.  Vaccine Information Statement (VIS) sheet handed to patient.  Patient understood requirement to wait 15 minute observation period.  Patient waited in clinic room.  Patient left clinic with no concerns.  Loraine Grip, RN

## 2020-01-25 NOTE — Progress Notes
Date of Service: 01/25/2020    Subjective:             Jeffrey Sanders is a 60 y.o. male.  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 Royce Sanders and HTN presented for annual follow-up.  He was last seen by me in December 2020.  He has been taking atenolol and chlorthalidone for hypertension however he had stopped taking aspirin and atorvastatin for past several months.  He has been taking the levothyroxine.  He continues to take the Effexor.He was seen by endocrinology externally.  He has not been seen recently and would like to make an appointment with endocrinologist.  Denied having any other acute concerns .  He declined flu vaccination and is contemplating getting COVID-19 vaccination.    History of Cowden's Dz (hamartoma syndrome), FAP - Familial Adenomatous Polyposis, or MEN syndrome: No  History of neck radiation exposure: No   Endocrinologist: Jeffrey Sanders  Was also seen by pulmonary and had a CT scan for pulmonary nodules and follow-up in 12 months has been recommended no bronchodilators have been started based on his symptoms and also the PFTs.  Patient voiced understanding of all the recommendations denied having any other acute concerns at this point of time.    6-8 beers per night.    He quit cold [place] in 2013 for 3 years and for the past 3 years again he has been drinking heavily.  He is not interested in going to the alcohol Anonymous or alcohol abuse program.  Medical History:   Diagnosis Date   ? Alcoholism (HCC)    ? Anxiety    ? Chest pain 03/26/2016   ? High blood pressure    ? Hypertension 03/26/2016   ? 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 Neoplasm of unspecified nature, site unspecified    ? 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 Ringing in the ears 12/27/2008   ? S/P radioactive iodine thyroid ablation    ? Unspecified endocrine disorder      Surgical History:   Procedure Laterality Date   ? ANGIOGRAPHY CORONARY ARTERY WITH LEFT HEART CATHETERIZATION WITH VENTRICULOGRAPHY Left 03/30/2016    Performed by Jeffrey Pastor, MD at [facility] C[facility] LAB   ? POSSIBLE PERCUTANEOUS CORONARY STENT PLACEMENT WITH ANGIOPLASTY N/A 03/30/2016    Performed by Jeffrey [facility]or[facility] MD at [facility] CATH LAB   ? COLONOSCOPY N/A 05/14/2016    Performed by Jeffrey Nine, MD at [facility] ENDO   ? COLONOSCOPY EXCISION LESION  05/14/2016    Performed by Jeffrey Nine, MD at [facility] ENDO   ? ESOPHAGOGASTRODUODENOSCOPY N/A 06/04/2016    Performed by Jeffrey Hoist, DO at [facility] ENDO   ? ESOPHAGOGASTRODUODENOSCOPY BIOPSY  06/04/2016    Performed by Jeffrey Hoist, DO at [facility] ENDO   ? HX THYROIDECTOMY     ? THYROIDECTOMY       Social History   ? Marital status: Single     Spouse name: N/A   ? Number of children: N/A   ? Years of education: N/A     Occupational History   ? 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 ?He does have a history of smoking of 3 packs a day, he quit a few years ago. ?He currently chews tobacco. Has a history of significant alcohol use, he used to drink 14 -  15 beers a night. ?Patient tells me that currently he drinks 8-10 beers every night.       Social History Main Topics   ? Smoking status: Former Smoker     Packs/day: 1.00     Years: 26.00     Types: Cigarettes     Quit date: 01/13/2008   ? Smokeless tobacco: Current User     Types: Chew      Comment: Counseling given 1.31.13   ? Alcohol use No   ? Drug use: No   ? Sexual activity: Not on file          Review of Systems   Constitutional: Negative for activity change, appetite change, fatigue, fever and unexpected weight change.   HENT: Negative for dental problem, sore throat, tinnitus, trouble swallowing and voice change.    Eyes: Negative for visual disturbance.   Respiratory: Negative for shortness of breath.    Cardiovascular: Negative for chest pain, palpitations and leg swelling.   Gastrointestinal: Negative for abdominal pain.   Genitourinary: Negative for difficulty urinating and frequency.   Musculoskeletal: Negative for arthralgias and joint swelling.   Neurological: Negative for dizziness and headaches.   Hematological: Negative for adenopathy.   Psychiatric/Behavioral: Negative for behavioral problems, decreased concentration, dysphoric mood and sleep disturbance. The patient is not nervous/anxious.      Objective:         ? atenoloL (TENORMIN) 25 mg tablet Take one tablet by mouth daily.   ? atorvastatin (LIPITOR) 40 mg tablet Take one tablet by mouth daily.   ? chlorthalidone (HYGROTON) 25 mg tablet Take one tablet by mouth daily.   ? levothyroxine (SYNTHROID) 125 mcg tablet Take one tablet by mouth daily 30 minutes before breakfast.   ? other medication Apply one Dose topically to affected area daily. Medication Name & Strength: Testosterone Gel 2%  Apply 4 clicks ( ) topically daily   ? venlafaxine XR (EFFEXOR XR) 150 mg capsule Take one capsule by mouth daily. Take with food.     Vitals:    01/25/20 1323   BP: (!) 140/94   BP Source: Arm, Right Upper   Patient Position: Sitting   Pulse: 83   Resp: 16   Temp: 36.8 ?C (98.2 ?F)   TempSrc: Oral   SpO2: 97%   Weight: 73.9 kg (163 lb)   Height: 172.7 cm (68)   PainSc: Zero     Body mass index is 24.78 kg/m?Marland Kitchen     Physical Exam  General Appearance: normal in appearance  Skin: warm, moist,   Eyes: conjunctivae mildly red and  pupils are equal and round  Respiratory Effort: breathing comfortably, no respiratory distress  Cardiac Rhythm: regular rhythm and normal rate  Lower Extremity Edema: no lower extremity edema  Abdominal Exam: soft, non-tender, no masses, bowel sounds normal  Neurologic Exam: neurological assessment grossly intact   Psych: Mood appropriate.  Assessment and Plan:    Anxiety; counseling has been provided and  continue effexor xr 150 mg.Lexapro and buspar has been tried .     History of T2, NX, MX papillary thyroid cancer ,left-followed closely by Endocrinology.  [place] FNA 06/2009 - No lesional tissue identified, Thyroglobulin AB <20. Non detectable last year Tgb recently 02/04/10. [place] L with stable2+cm remnant visible. Follwos with Zhou, [place] T and Tgb panel ,TSH lower normal recently.On 125 mcg on synthroid.  TSH is being ordered as he has not been following with his other endocrinologist.    Hypertension Management: Continue  on chlorthalidone and atenolol.  He is followed by local cardiologist.  BP Readings from Last 3 Encounters:   01/25/20 (!) 140/94   12/26/18 135/86   07/22/18 132/78     Male hypogonadism;  Testosterone is not on the list of medications     Tobacco abuse; counseling has been provided.    Hyperlipidemia Management -is not currently taking statin and fasting lipid profile is pending  Lab Results   Component Value Date/Time    CHOL 155 05/15/2019 12:00 AM    TRIG 208 (A) 05/15/2019 12:00 AM    HDL 66 (A) 05/15/2019 12:00 AM    LDL 65 05/15/2019 12:00 AM    VLDL 42 (A) 05/15/2019 12:00 AM    NONHDLCHOL 156 02/15/2018 10:59 AM    CHOLHDLC 2 05/15/2019 12:00 AM    ALT 28 02/15/2018 10:59 AM    CK 41 03/19/2016 12:00 AM    VITD25 73 05/15/2019 12:00 AM        Imp: Hyperlipidemia fair     Plan:  Discussed labs and reviewed goals for LDL, HDL, triglycerides.   Discussed exercise management and diet with emphasis on vegetables, fruit and lean meat.  Are barriers to achieving goals present? No  Medication education provided. Patient voiced understanding? Yes  Patient able to self-manage and ready to comply?Yes  Educational resources identified? Verbal Counseling     Pulmonary nodules likely nonmetastatic however will continue to monitor in 12 months however he skipped getting the CT scan in 2019 so it has been reordered and encouraged to get it done  - PPSV 23 given on 04/15/16, will need PSV when age >70  ?  Insomnia; he had self discontinued trazodone in the past and has not been taking it.  Sleep hygiene has been discussed.  Alcohol cessation has been reinforced    Problem   Other Forms of Angina Pectoris (Hcc)   Screening for prostate cancer - PSA is being ordered.  HIV screening is negative  Hep C is negative    First dose of Pfizer vaccination has been provided in the office.       Patient Instructions     Routine Clinic Information:     Please don't hesitate to call if you have any problems or questions. Sam can be reached at [phone].     You may also message [place] in MyChart.     For refills on medications, please have your pharmacy fax a refill authorization request form to our office at Fax) [phone].     Please allow at least 3 business days for refill requests.     For urgent issues after business hours/weekends/holidays call [phone] and request for the outpatient internal medicine physician to be paged .    We offer same day appointments for your acute health concerns. These appointments are on a first come, first serve basis.     Please call [phone] if you would like to make an appointment. If I am not available, you can see any of my partners or try to see me the next day (call at 8am).     See you in  6 months     Orders Placed This Encounter   ? Pfizer SARS-CoV-2 Vaccine   ? LIPID PROFILE   ? CBC AND DIFF   ? COMPREHENSIVE METABOLIC PANEL   ? TSH WITH FREE T4 REFLEX   ? PSA SCREEN   ? COMPREHENSIVE METABOLIC PANEL   ? CBC AND DIFF   ?  LIPID PROFILE   ? PSA SCREEN   ? TSH WITH FREE T4 REFLEX   ? levothyroxine (SYNTHROID) 125 mcg tablet   ? venlafaxine XR (EFFEXOR XR) 150 mg capsule     Please get the CT chest scheduled for follow up .    Please schedule appt with Endocrinology     Please get the fasting blood work done .     Take care,    Dr.Jhovanny Guinta & Sam

## 2020-01-26 DIAGNOSIS — F419 Anxiety disorder, unspecified: Secondary | ICD-10-CM

## 2020-01-26 DIAGNOSIS — F32A Depression, unspecified: Secondary | ICD-10-CM

## 2020-01-31 ENCOUNTER — Encounter: Admit: 2020-01-31 | Discharge: 2020-01-31 | Payer: BC Managed Care – HMO

## 2020-01-31 DIAGNOSIS — C73 Malignant neoplasm of thyroid gland: Secondary | ICD-10-CM

## 2020-01-31 DIAGNOSIS — F419 Anxiety disorder, unspecified: Secondary | ICD-10-CM

## 2020-01-31 DIAGNOSIS — Z125 Encounter for screening for malignant neoplasm of prostate: Secondary | ICD-10-CM

## 2020-01-31 DIAGNOSIS — Z0000 Encounter for general adult medical examination without abnormal findings: Secondary | ICD-10-CM

## 2020-01-31 DIAGNOSIS — I10 Essential (primary) hypertension: Secondary | ICD-10-CM

## 2020-03-04 ENCOUNTER — Encounter

## 2020-03-04 DIAGNOSIS — R911 Solitary pulmonary nodule: Secondary | ICD-10-CM

## 2020-03-06 ENCOUNTER — Encounter: Admit: 2020-03-06 | Discharge: 2020-03-06 | Payer: BC Managed Care – HMO

## 2020-03-06 DIAGNOSIS — R918 Other nonspecific abnormal finding of lung field: Secondary | ICD-10-CM

## 2020-03-07 ENCOUNTER — Encounter: Admit: 2020-03-07 | Discharge: 2020-03-07 | Payer: BC Managed Care – HMO

## 2020-03-21 ENCOUNTER — Ambulatory Visit: Admit: 2020-03-21 | Discharge: 2020-03-21 | Payer: BC Managed Care – HMO

## 2020-03-21 ENCOUNTER — Encounter: Admit: 2020-03-21 | Discharge: 2020-03-21 | Payer: BC Managed Care – HMO

## 2020-03-21 DIAGNOSIS — R918 Other nonspecific abnormal finding of lung field: Secondary | ICD-10-CM

## 2020-03-21 LAB — POC GLUCOSE: Lab: 100 mg/dL (ref 70–100)

## 2020-03-21 MED ORDER — RP DX F-18 FDG MCI
10 | Freq: Once | INTRAVENOUS | 0 refills | Status: CP
Start: 2020-03-21 — End: ?
  Administered 2020-03-21: 20:00:00 11.3 via INTRAVENOUS

## 2020-03-22 ENCOUNTER — Encounter: Admit: 2020-03-22 | Discharge: 2020-03-22 | Payer: BC Managed Care – HMO

## 2020-03-22 DIAGNOSIS — R918 Other nonspecific abnormal finding of lung field: Secondary | ICD-10-CM

## 2020-03-22 DIAGNOSIS — R911 Solitary pulmonary nodule: Secondary | ICD-10-CM

## 2020-03-22 NOTE — Telephone Encounter
Tyra was called and left a message about 4 month follow up CT chest . Order for CT placed .     Thanks ,    Dr. Campbell Stall

## 2020-05-27 ENCOUNTER — Encounter: Admit: 2020-05-27 | Discharge: 2020-05-27 | Payer: BC Managed Care – HMO

## 2020-05-27 DIAGNOSIS — I208 Other forms of angina pectoris: Secondary | ICD-10-CM

## 2020-05-27 DIAGNOSIS — C73 Malignant neoplasm of thyroid gland: Secondary | ICD-10-CM

## 2020-05-27 MED ORDER — CHLORTHALIDONE 25 MG PO TAB
25 mg | ORAL_TABLET | Freq: Every day | ORAL | 1 refills | Status: AC
Start: 2020-05-27 — End: ?

## 2020-05-27 MED ORDER — ATORVASTATIN 40 MG PO TAB
40 mg | ORAL_TABLET | Freq: Every day | ORAL | 1 refills | Status: AC
Start: 2020-05-27 — End: ?

## 2020-05-27 MED ORDER — ATENOLOL 25 MG PO TAB
25 mg | ORAL_TABLET | Freq: Every day | ORAL | 1 refills | 33.00000 days | Status: AC
Start: 2020-05-27 — End: ?

## 2020-05-27 NOTE — Telephone Encounter
Some refill protocol elements NOT Met  Medication name: chlorthalidone  Medication Strength: 25mg     Last Fill Date: 12/04/19    Abnormal labs    Provided refill: Glucose was elevated at 125 rest of the liver and kidney numbers were fairly within normal limits.

## 2020-07-03 ENCOUNTER — Encounter: Admit: 2020-07-03 | Discharge: 2020-07-03 | Payer: BC Managed Care – HMO

## 2020-07-12 ENCOUNTER — Encounter: Admit: 2020-07-12 | Discharge: 2020-07-12 | Payer: BC Managed Care – HMO

## 2020-07-24 ENCOUNTER — Ambulatory Visit: Admit: 2020-07-24 | Discharge: 2020-07-25 | Payer: BC Managed Care – HMO

## 2020-07-24 ENCOUNTER — Encounter: Admit: 2020-07-24 | Discharge: 2020-07-24 | Payer: BC Managed Care – HMO

## 2020-07-24 ENCOUNTER — Ambulatory Visit: Admit: 2020-07-24 | Discharge: 2020-07-24 | Payer: BC Managed Care – HMO

## 2020-07-24 DIAGNOSIS — E349 Endocrine disorder, unspecified: Secondary | ICD-10-CM

## 2020-07-24 DIAGNOSIS — I10 Essential (primary) hypertension: Secondary | ICD-10-CM

## 2020-07-24 DIAGNOSIS — Z923 Personal history of irradiation: Secondary | ICD-10-CM

## 2020-07-24 DIAGNOSIS — C73 Malignant neoplasm of thyroid gland: Secondary | ICD-10-CM

## 2020-07-24 DIAGNOSIS — R918 Other nonspecific abnormal finding of lung field: Secondary | ICD-10-CM

## 2020-07-24 DIAGNOSIS — F1020 Alcohol dependence, uncomplicated: Secondary | ICD-10-CM

## 2020-07-24 DIAGNOSIS — N529 Male erectile dysfunction, unspecified: Secondary | ICD-10-CM

## 2020-07-24 DIAGNOSIS — E559 Vitamin D deficiency, unspecified: Secondary | ICD-10-CM

## 2020-07-24 DIAGNOSIS — R079 Chest pain, unspecified: Secondary | ICD-10-CM

## 2020-07-24 DIAGNOSIS — R7309 Other abnormal glucose: Secondary | ICD-10-CM

## 2020-07-24 DIAGNOSIS — E785 Hyperlipidemia, unspecified: Secondary | ICD-10-CM

## 2020-07-24 DIAGNOSIS — Z0000 Encounter for general adult medical examination without abnormal findings: Secondary | ICD-10-CM

## 2020-07-24 DIAGNOSIS — F419 Anxiety disorder, unspecified: Secondary | ICD-10-CM

## 2020-07-24 DIAGNOSIS — E042 Nontoxic multinodular goiter: Secondary | ICD-10-CM

## 2020-07-24 DIAGNOSIS — D499 Neoplasm of unspecified behavior of unspecified site: Secondary | ICD-10-CM

## 2020-07-24 DIAGNOSIS — H9319 Tinnitus, unspecified ear: Secondary | ICD-10-CM

## 2020-07-24 DIAGNOSIS — F1010 Alcohol abuse, uncomplicated: Secondary | ICD-10-CM

## 2020-07-24 LAB — CBC AND DIFF
ABSOLUTE BASO COUNT: 0 K/UL (ref 0–0.20)
ABSOLUTE EOS COUNT: 0 K/UL (ref 0–0.45)
ABSOLUTE LYMPH COUNT: 1.1 K/UL (ref 1.0–4.8)
ABSOLUTE MONO COUNT: 0.4 K/UL (ref 0–0.80)
ABSOLUTE NEUTROPHIL: 2.8 K/UL (ref 1.8–7.0)
BASOPHILS %: 1 % (ref 0–2)
EOSINOPHILS %: 2 % (ref >60)
HEMATOCRIT: 44 % (ref 40–50)
HEMOGLOBIN: 15 g/dL (ref 13.5–16.5)
LYMPHOCYTES %: 25 % — ABNORMAL HIGH (ref 24–44)
MCH: 33 pg (ref 26–34)
MCHC: 35 g/dL (ref 32.0–36.0)
MCV: 96 FL (ref 80–100)
MONOCYTES %: 9 % (ref 4–12)
MPV: 8.7 FL — ABNORMAL HIGH (ref 7–11)
NEUTROPHILS %: 63 % (ref 41–77)
PLATELET COUNT: 178 K/UL (ref 150–400)
RBC COUNT: 4.6 M/UL — ABNORMAL HIGH (ref 4.4–5.5)
RDW: 12 % (ref 11–15)
WBC COUNT: 4.4 K/UL — ABNORMAL LOW (ref 4.5–11.0)

## 2020-07-24 LAB — FREE T4-FREE THYROXINE: FREE T4: 1 ng/dL (ref 0.6–1.6)

## 2020-07-24 LAB — COMPREHENSIVE METABOLIC PANEL
POTASSIUM: 3.8 MMOL/L (ref 3.5–5.1)
SODIUM: 140 MMOL/L (ref 137–147)

## 2020-07-24 LAB — TSH WITH FREE T4 REFLEX: TSH: 0.3 uU/mL — ABNORMAL LOW (ref 0.35–5.00)

## 2020-07-24 MED ORDER — VENLAFAXINE 150 MG PO CP24
300 mg | ORAL_CAPSULE | Freq: Every day | ORAL | 0 refills | Status: AC
Start: 2020-07-24 — End: ?

## 2020-07-24 MED ORDER — OTHER MEDICATION
1 | Freq: Every day | TOPICAL | 5 refills | 57.00000 days | Status: AC
Start: 2020-07-24 — End: ?

## 2020-07-24 MED ORDER — TESTOSTERONE 10 MG/0.5 GRAM /ACTUATION TD GLPM
4 | Freq: Every day | TRANSDERMAL | 5 refills | Status: CN
Start: 2020-07-24 — End: ?

## 2020-07-24 MED ORDER — CHLORTHALIDONE 25 MG PO TAB
25 mg | ORAL_TABLET | Freq: Every day | ORAL | 1 refills | Status: AC
Start: 2020-07-24 — End: ?

## 2020-07-24 MED ORDER — LEVOTHYROXINE 125 MCG PO TAB
125 ug | ORAL_TABLET | Freq: Every day | ORAL | 1 refills | 30.00000 days | Status: AC
Start: 2020-07-24 — End: ?

## 2020-07-24 MED ORDER — ATENOLOL 25 MG PO TAB
25 mg | ORAL_TABLET | Freq: Every day | ORAL | 1 refills | 33.00000 days | Status: AC
Start: 2020-07-24 — End: ?

## 2020-07-24 MED ORDER — ATORVASTATIN 40 MG PO TAB
40 mg | ORAL_TABLET | Freq: Every day | ORAL | 1 refills | Status: AC
Start: 2020-07-24 — End: ?

## 2020-07-24 MED ORDER — VENLAFAXINE 150 MG PO CP24
150 mg | ORAL_CAPSULE | Freq: Every day | ORAL | 1 refills | Status: DC
Start: 2020-07-24 — End: 2020-07-24

## 2020-07-24 NOTE — Telephone Encounter
07/24/2020 11:29 AM Called into pharmacy.

## 2020-07-24 NOTE — Telephone Encounter
Some refill protocol elements NOT Met  Medication name: Testosterone gel  Medication Strength: 2%    Last Fill Date: 12/05/19    Non-delegated medication    Routed to Provider

## 2020-07-24 NOTE — Progress Notes
Date of Service: 07/24/2020    Subjective:             Jeffrey Sanders is a 60 y.o. male.  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 Royce Macadamia and HTN presented for follow-up.  He was last seen by me in January 2022.  He reports consuming alcohol every evening.  On average he drinks about 6-8 beers per night.He quit cold [place] in 2013 for 3 years and for the past 3 years again he has been drinking heavily.  He is not interested in going to the alcohol Anonymous or alcohol abuse program.  He has been taking venlafaxine and is requesting for refill.  We did talk about adequate control of the symptoms and his anxiety and depression or fair but would like that medication to be increased.  He has been taking atenolol and chlorthalidone for hypertension and atorvastatin for hyperlipidemia.  His last thyroid level was abnormal so TSH has been ordered and Synthroid has been provided.  He was seen by endocrinology externally.   History of Cowden's Dz (hamartoma syndrome), FAP - Familial Adenomatous Polyposis, or MEN syndrome: No  History of neck radiation exposure: No   Endocrinologist: Jeffrey Sanders  Was also seen by pulmonary and had a CT scan for pulmonary nodules and follow-up in 12 months has been recommended no bronchodilators have been started based on his symptoms and also the PFTs.  Patient voiced understanding of all the recommendations denied having any other acute concerns at this point of time.        Medical History:   Diagnosis Date   ? Alcoholism (HCC)    ? Anxiety    ? Chest pain 03/26/2016   ? High blood pressure    ? Hypertension 03/26/2016   ? 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 Neoplasm of unspecified nature, site unspecified    ? 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 Ringing in the ears 12/27/2008   ? S/P radioactive iodine thyroid ablation    ? Unspecified endocrine disorder      Surgical History:   Procedure Laterality Date   ? ANGIOGRAPHY CORONARY ARTERY WITH LEFT HEART CATHETERIZATION WITH VENTRICULOGRAPHY Left 03/30/2016    Performed by Laney Pastor, MD at [facility] CATH LAB   ? POSSIBLE PERCUTANEOUS CORONARY STENT PLACEMENT WITH ANGIOPLASTY N/A 03/30/2016    Performed by Laney Pastor[facility] at [facility] CATH LAB   ? COLONOSCOPY N/A 05/14/2016    Performed by Eliott Nine, MD at [facility] ENDO   ? COLONOSCOPY EXCISION LESION  05/14/2016    Performed by Eliott Nine, MD at [facility] ENDO   ? ESOPHAGOGASTRODUODENOSCOPY N/A 06/04/2016    Performed by Tempie Hoist, DO at [facility] ENDO   ? ESOPHAGOGASTRODUODENOSCOPY BIOPSY  06/04/2016    Performed by Tempie Hoist, DO at [facility] ENDO   ? HX THYROIDECTOMY     ? THYROIDECTOMY       Social History   ? Marital status: Single     Spouse name: N/A   ? Number of children: N/A   ? Years of education: N/A     Occupational History   ? 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 ?He does have a history of smoking of 3 packs a day, he quit a few years ago. ?He currently chews tobacco. Has a history of  significant alcohol use, he used to drink 14 -15 beers a night. ?Patient tells me that currently he drinks 8-10 beers every night.       Social History Main Topics   ? Smoking status: Former Smoker     Packs/day: 1.00     Years: 26.00     Types: Cigarettes     Quit date: 01/13/2008   ? Smokeless tobacco: Current User     Types: Chew      Comment: Counseling given 1.31.13   ? Alcohol use No   ? Drug use: No   ? Sexual activity: Not on file          Review of Systems   Constitutional: Negative for activity change, appetite change, fatigue, fever and unexpected weight change.   HENT: Negative for dental problem, sore throat, tinnitus, trouble swallowing and voice change.    Eyes: Negative for visual disturbance.   Respiratory: Negative for shortness of breath.    Cardiovascular: Negative for chest pain, palpitations and leg swelling.   Gastrointestinal: Negative for abdominal pain, constipation, diarrhea and nausea.   Genitourinary: Negative for difficulty urinating and frequency.   Musculoskeletal: Negative for arthralgias and joint swelling.   Neurological: Negative for dizziness, seizures and headaches.        Does report to me about possible alcohol withdrawal when he does not drink for a day.  He was constantly moving his feet during the appointment.   Hematological: Negative for adenopathy.   Psychiatric/Behavioral: Negative for behavioral problems, decreased concentration, dysphoric mood and sleep disturbance. The patient is not nervous/anxious.      Objective:         ? atenoloL (TENORMIN) 25 mg tablet Take one tablet by mouth daily.   ? atorvastatin (LIPITOR) 40 mg tablet Take one tablet by mouth daily.   ? chlorthalidone (HYGROTON) 25 mg tablet Take one tablet by mouth daily.   ? levothyroxine (SYNTHROID) 125 mcg tablet Take one tablet by mouth daily 30 minutes before breakfast. Do NOT take on Sundays   ? other medication Apply one Dose topically to affected area daily. Medication Name & Strength: Testosterone Gel 2%  Apply 4 clicks ( ) topically daily   ? sildenafiL (VIAGRA) 25 mg tablet Take one tablet by mouth as Needed for Erectile dysfunction. Max of 1 tablet per day.   ? venlafaxine XR (EFFEXOR XR) 150 mg capsule Take two capsules by mouth daily for 180 days. Take with food.     Vitals:    07/24/20 1315   BP: 125/80   Pulse: 101   Resp: 16   PainSc: Zero   Weight: 74.2 kg (163 lb 9.6 oz)   Height: 172.7 cm (5' 8)     Body mass index is 24.88 kg/m?Marland Kitchen     Physical Exam  General Appearance: normal in appearance  Skin: warm, moist,   Eyes: conjunctivae mildly red and  pupils are equal and round  Respiratory Effort: breathing comfortably, no respiratory distress  Cardiac Rhythm: regular rhythm and normal rate  Lower Extremity Edema: no lower extremity edema  Abdominal Exam: soft, non-tender, no masses, bowel sounds normal  Neurologic Exam: neurological assessment grossly intact Psych: Mood appropriate.  Assessment and Plan:    Anxiety; counseling has been provided and increase the Effexor XR to 300 mg every day.Lexapro and buspar has been tried .     History of T2, NX, MX papillary thyroid cancer ,left-followed closely by Endocrinology.  [place] FNA 06/2009 - No lesional tissue  identified, Thyroglobulin AB <20. Non detectable last year Tgb recently 02/04/10. [place] L with stable2+cm remnant visible. FollOWs with Zhou, [place] T and Tgb panel ,TSH lower normal recently.On 125 mcg on synthroid.  TSH is being ordered as he has not been following with his other endocrinologist.    Hypertension Management: Continue on chlorthalidone and atenolol.  He is followed by local cardiologist.  BP Readings from Last 3 Encounters:   07/24/20 125/80   01/25/20 (!) 140/94   12/26/18 135/86     Male hypogonadism;  Testosterone is not on the list of medications       Hyperlipidemia Management -continue on atorvastatin.  Lab Results   Component Value Date/Time    CHOL 202 (A) 01/26/2020 12:00 AM    TRIG 86 01/26/2020 12:00 AM    HDL 59 01/26/2020 12:00 AM    LDL 103 01/26/2020 12:00 AM    VLDL 17 01/26/2020 12:00 AM    NONHDLCHOL 156 02/15/2018 10:59 AM    CHOLHDLC 3 01/26/2020 12:00 AM    ALT 60 (A) 01/26/2020 12:00 AM    CK 41 03/19/2016 12:00 AM    VITD25 73 05/15/2019 12:00 AM        Imp: Hyperlipidemia fair     Plan:  Discussed labs and reviewed goals for LDL, HDL, triglycerides.   Discussed exercise management and diet with emphasis on vegetables, fruit and lean meat.  Are barriers to achieving goals present? No  Medication education provided. Patient voiced understanding? Yes  Patient able to self-manage and ready to comply?Yes  Educational resources identified? Verbal Counseling     Pulmonary nodules  CT scan pending for follow up .  PET scan and a CT scan of the lung has been reviewed.    PPSV 23 given on 04/15/16, will need PSV when age >35  ?  Insomnia; he had self discontinued trazodone in the past and has not been taking it.  Sleep hygiene has been discussed.  Alcohol cessation has been reinforced     Screening for prostate cancer - PSA is normal .  HIV screening is negative  Hep C is negative         Patient Instructions     Routine Clinic Information:     Please don't hesitate to call if you have any problems or questions. Sam can be reached at [phone].     You may also message [place] in MyChart.     For refills on medications, please have your pharmacy fax a refill authorization request form to our office at Fax) [phone].     Please allow at least 3 business days for refill requests.     For urgent issues after business hours/weekends/holidays call [phone] and request for the outpatient internal medicine physician to be paged .    We offer same day appointments for your acute health concerns. These appointments are on a first come, first serve basis.     Please call [phone] if you would like to make an appointment. If I am not available, you can see any of my partners or try to see me the next day (call at 8am).     See you in  6 months     Orders Placed This Encounter   ? COMPREHENSIVE METABOLIC PANEL   ? CBC AND DIFF   ? TSH WITH FREE T4 REFLEX   ? 25-OH VITAMIN D (D2 + D3)   ? HEMOGLOBIN A1C  today   ? venlafaxine XR (EFFEXOR XR)  150 mg capsule   ? chlorthalidone (HYGROTON) 25 mg tablet   ? atenoloL (TENORMIN) 25 mg tablet   ? levothyroxine (SYNTHROID) 125 mcg tablet   ? atorvastatin (LIPITOR) 40 mg tablet     Please set up the CT chest - order in place already     VENLAFAXINE HAS BEEN INCREASED TO 300 MG EVERY DAY     Take care,    Dr.Fawn Desrocher & Sam        *Dictated with Animal nutritionist, please forgive typos    Total of 40 minutes were spent on the same day of the visit including preparing to see the patient, obtaining and/or reviewing separately obtained history, performing a medically appropriate examination and/or evaluation, counseling and educating the patient/family/caregiver, ordering medications, tests, or procedures, referring and communication with other health care professionals, documenting clinical information in the electronic or other health record, independently interpreting results and communicating results to the patient/family/caregiver, and care coordination.

## 2020-07-25 DIAGNOSIS — I208 Other forms of angina pectoris: Secondary | ICD-10-CM

## 2020-07-25 DIAGNOSIS — R7309 Other abnormal glucose: Secondary | ICD-10-CM

## 2020-07-25 DIAGNOSIS — F32A Depression, unspecified: Secondary | ICD-10-CM

## 2020-07-25 DIAGNOSIS — F419 Anxiety disorder, unspecified: Secondary | ICD-10-CM

## 2020-07-25 DIAGNOSIS — I10 Essential (primary) hypertension: Secondary | ICD-10-CM

## 2020-08-08 ENCOUNTER — Encounter: Admit: 2020-08-08 | Discharge: 2020-08-08 | Payer: BC Managed Care – HMO

## 2020-10-02 ENCOUNTER — Encounter: Admit: 2020-10-02 | Discharge: 2020-10-02 | Payer: BC Managed Care – HMO

## 2020-10-09 ENCOUNTER — Encounter: Admit: 2020-10-09 | Discharge: 2020-10-09 | Payer: BC Managed Care – HMO

## 2020-10-10 NOTE — Telephone Encounter
-----   Message from Octaviano Batty, RN sent at 10/07/2020 10:15 AM CDT -----  Dr. Campbell Stall,     Mr. Sangiovanni had an order placed for a CT chest. The order is now expired, is this something that still needs to be repeated? Just let me know either way!      Please review the attached imaging report, this patient has an overdue follow up imaging recommendation based on findings by the Radiologist. Please respond with one of the following:    I reviewed the chart and the requested follow up imaging/test and...  A. Per clinical scenario/patient discussion, follow up imaging is not medically necessary.  B. Follow up imaging is appropriate, exam has been ordered and clinic personnel will communicate with the patient. (Please provide the clinic personnel's name)  C. Follow up imaging is appropriate, but the patient is no longer under our care. Defer to another physician. (Please provide the name of the physician, if known)  D. This is not my patient. Please defer to the appropriate provider.   E. Follow up recommendation has been completed at another facility. (Please provide the date and name of the facility or document in the EMR)  F. Patient is aware of the findings and has declined further follow up.         Please contact us directly if you have questions:    [place] [address] [place] [address] [place] [address]    Closed Loop Imaging Program #Follow

## 2020-10-10 NOTE — Telephone Encounter
Patient  is not interested in  any further testing at this point of time.

## 2020-11-22 ENCOUNTER — Encounter: Admit: 2020-11-22 | Discharge: 2020-11-22 | Payer: BC Managed Care – HMO

## 2021-01-17 ENCOUNTER — Encounter: Admit: 2021-01-17 | Discharge: 2021-01-17 | Payer: BC Managed Care – HMO

## 2021-01-17 MED ORDER — VENLAFAXINE 150 MG PO CP24
300 mg | ORAL_CAPSULE | Freq: Every day | ORAL | 0 refills | Status: AC
Start: 2021-01-17 — End: ?

## 2021-01-20 ENCOUNTER — Encounter: Admit: 2021-01-20 | Discharge: 2021-01-20 | Payer: BC Managed Care – HMO

## 2021-01-20 ENCOUNTER — Ambulatory Visit: Admit: 2021-01-20 | Discharge: 2021-01-20 | Payer: BC Managed Care – HMO

## 2021-01-20 DIAGNOSIS — I10 Essential (primary) hypertension: Secondary | ICD-10-CM

## 2021-01-20 DIAGNOSIS — F1020 Alcohol dependence, uncomplicated: Secondary | ICD-10-CM

## 2021-01-20 DIAGNOSIS — F419 Anxiety disorder, unspecified: Secondary | ICD-10-CM

## 2021-01-20 DIAGNOSIS — F32A Depression, unspecified: Secondary | ICD-10-CM

## 2021-01-20 DIAGNOSIS — E890 Postprocedural hypothyroidism: Secondary | ICD-10-CM

## 2021-01-20 DIAGNOSIS — D499 Neoplasm of unspecified behavior of unspecified site: Secondary | ICD-10-CM

## 2021-01-20 DIAGNOSIS — I208 Other forms of angina pectoris: Secondary | ICD-10-CM

## 2021-01-20 DIAGNOSIS — Z923 Personal history of irradiation: Secondary | ICD-10-CM

## 2021-01-20 DIAGNOSIS — H9319 Tinnitus, unspecified ear: Secondary | ICD-10-CM

## 2021-01-20 DIAGNOSIS — E349 Endocrine disorder, unspecified: Secondary | ICD-10-CM

## 2021-01-20 DIAGNOSIS — E042 Nontoxic multinodular goiter: Secondary | ICD-10-CM

## 2021-01-20 DIAGNOSIS — M20011 Mallet finger of right finger(s): Secondary | ICD-10-CM

## 2021-01-20 DIAGNOSIS — R079 Chest pain, unspecified: Secondary | ICD-10-CM

## 2021-01-20 DIAGNOSIS — C73 Malignant neoplasm of thyroid gland: Secondary | ICD-10-CM

## 2021-01-20 DIAGNOSIS — Z23 Encounter for immunization: Secondary | ICD-10-CM

## 2021-01-20 DIAGNOSIS — Z0000 Encounter for general adult medical examination without abnormal findings: Secondary | ICD-10-CM

## 2021-01-20 DIAGNOSIS — Z125 Encounter for screening for malignant neoplasm of prostate: Secondary | ICD-10-CM

## 2021-01-20 DIAGNOSIS — E785 Hyperlipidemia, unspecified: Secondary | ICD-10-CM

## 2021-01-20 MED ORDER — CHLORTHALIDONE 25 MG PO TAB
25 mg | ORAL_TABLET | Freq: Every day | ORAL | 1 refills | Status: AC
Start: 2021-01-20 — End: ?

## 2021-01-20 MED ORDER — ATORVASTATIN 40 MG PO TAB
40 mg | ORAL_TABLET | Freq: Every day | ORAL | 1 refills | Status: AC
Start: 2021-01-20 — End: ?

## 2021-01-20 MED ORDER — ATENOLOL 25 MG PO TAB
25 mg | ORAL_TABLET | Freq: Every day | ORAL | 1 refills | 33.00000 days | Status: AC
Start: 2021-01-20 — End: ?

## 2021-01-20 MED ORDER — LEVOTHYROXINE 125 MCG PO TAB
125 ug | ORAL_TABLET | Freq: Every day | ORAL | 1 refills | 30.00000 days | Status: AC
Start: 2021-01-20 — End: ?

## 2021-01-20 NOTE — Patient Instructions
Mallet Finger  You have an injury to your finger causing the tip of your finger to droop down. This makes your finger look like a small hammer or mallet. This is why it?s given this name. It's also called baseball finger. This injury happens when the tendon that holds the finger straight at the last joint tears. Sometimes a small break happens where this tendon attaches to the bone. This causes local pain, swelling, and bruising. This injury usually takes about 4 to 6 weeks to heal. This injury is often treated with a special finger splint called a stack splint. It holds the tendon in the correct position. But even with the right treatment, it may not be possible to fully straighten that joint after the injury heals. After healing, the joint may be stiff. But it usually recovers flexibility over time.   Home care  Keep your arm raised (elevated) to reduce pain and swelling. When sitting or lying down, elevate your arm above the level of your heart. You can do this by placing your arm on a pillow that rests on your chest, or on a pillow at your side. This is most important during the first 48 hours after the injury.  Put an ice pack over the injured area for 15 to 20 minutes every 3 to 6 hours. You should do this for the first 24 to 48 hours. You can make an ice pack by filling a plastic bag that seals at the top with ice cubes and then wrapping it with a thin towel. Be careful not to injure your skin with the ice treatments. Ice should never be applied directly to skin. Keep using ice packs to ease pain and swelling as needed. After 48 to 72 hours, or as directed by your healthcare provider, apply heat (warm shower or warm bath) for 15 to 20 minutes several times a day. Or switch between ice and heat.  If a splint was applied, keep it in place for the time advised. If you remove it too soon, it will cause the tendon's position to change and the finger joint will heal in a bent position.  You may use over-the-counter pain medicine to control pain, unless another pain medicine was prescribed. Talk with your healthcare provider before using these medicines if you have long-term (chronic) liver or kidney disease, ever had a stomach ulcer or gastrointestinal bleeding, or take blood thinners.  Most people can return to normal activity while wearing the splint. Discuss any limitations with your healthcare provider.     Follow-up care  Follow up with your healthcare provider as advised.   If X-rays were taken, you'll be told of any new findings that may affect your care.    When to get medical advice  Call your healthcare provider right away if any of the following occur:  Pain or swelling in the injured finger increases  The finger becomes red or warm   Injured finger becomes cold, blue, numb, or tingly  [employer] last reviewed this educational content on 04/12/2020  ? 2000-2022 [employer], [place]. All rights reserved. This information is not intended as a substitute for professional medical care. Always follow your healthcare professional's instructions.          Health Screening Guidelines, Men Ages 59 to 30   Screening tests and health counseling are a key part of managing your health. A screening test is done to find disorders or diseases in people who don't have any symptoms. Screening tests  are not used to diagnose. They are used to find out if more testing is needed. The goal may be to find a disease early so it can be treated with more success. Or the goal may be to find a disease early so you can make lifestyle changes. You may need regular checkups to help you reduce your risk of disease.   Below are guidelines for men ages 40 to 37. Talk with your healthcare provider. Make sure you?re up-to-date on what you need.   We understand gender is a spectrum. We may use gendered terms to talk about anatomy and health risk. Please use this information in a way that works best for you and your provider as you talk about your care. Screening  Who needs it How often    Unhealthy alcohol use  All men in this age group  At routine exams   Blood pressure All men in this age group  Once a year if your blood pressure is normal. Normal blood pressure is less than 120/80 mm Hg. If your blood pressure is higher than this, follow the advice of your healthcare provider.    Colorectal cancer All men at average risk in this age group  Talk with your healthcare provider about which test below is right for you:   Colonoscopy every 10 years  Flexible sigmoidoscopy every 5 years (or every 10 years with yearly fecal immunochemical test (FIT) stool test)  CT colonography (virtual colonoscopy) every 5 years  Yearly fecal occult blood test  Yearly FIT  Stool DNA test every 1 to 3 years  If you have a test that is not a colonoscopy and have an abnormal test result, you will need a colonoscopy.   You may need to be screened more or less often. This is based on personal or family health history. Talk with your healthcare provider.    Depression All men in this age group  At routine exams   Type 2 diabetes or prediabetes  All in this age group  At least every 3 years (yearly if your blood sugar has already begun to rise)    Type 2 diabetes All men with prediabetes  Every year   Hepatitis C Men at higher risk for infection. Test 1 time for men born between [birth year] and 1963.  Talk with your healthcare provider.    High cholesterol or triglycerides  All men in this age group  At least every 4 to 6 years. Talk with your healthcare provider about your risk. Ask if you should be tested more often.    HIV All men in this age group  At least 1 time. Talk with your healthcare provider about your risk factors. Ask if you should be tested more often.    Lung cancer All men in this age group who are in fairly good health and are at higher risk for lung cancer, and who:   Smoke or quit in the past 15 years  Have a 20-pack per year smoking history (1 pack a day for 20 years or 2 packs a day for 10 years)  Expert groups vary in their advice. Talk with your healthcare provider.  Yearly lung cancer screening with a low-dose CT scan (LDCT). Talk with your healthcare provider.    Obesity All men in this age group  At yearly routine exams    BMI (body mass index) All men in this age group Every year, to help find out if you are at a  healthy weight for your height    Prostate cancer All men in this age group, talk with your healthcare provider about risks and benefits of a digital rectal exam (DRE) and prostate-specific antigen (PSA) screening  At routine exams if you decide to be tested.    Syphilis Men at higher risk for infection  At routine exams. Talk with your healthcare provider.    Tuberculosis Men at higher risk for infection  Talk with your healthcare provider    Vision All men in this age group  Talk with your healthcare provider   Health counseling  Who needs it  How often     Diet and exercise Men who are overweight or obese  When diagnosed, and then at routine exams     Sexually transmitted infection (STI) prevention  Men at higher risk for infection  At routine exams; talk with your healthcare provider     Use of daily aspirin  All men in this age group who are at high risk for cardiovascular problems and not at a higher risk for bleeding. Talk with your healthcare provider.  At routine exams, talk with your healthcare provider about the risks and benefits for you.     Use of statin medicines for cholesterol All men in this age group who have:   An LDL-C level of more than 70 mg/dL but less than 562 mg/dL, no diabetes and borderline to high level of risk  An LDL-C level of 190 mg/dL or higher  A diagnosis of diabetes and LDL-C level of higher than 70mg /dL At routine exams. More often if directed by your healthcare provider. Talk with your healthcare provider about your risk factors.     Use of tobacco and the health effects it can cause  All men in this age group  Every exam Routine Clinic Information:     Please don't hesitate to call if you have any problems or questions. Sam can be reached at [phone].     You may also message [place] in MyChart.     For refills on medications, please have your pharmacy fax a refill authorization request form to our office at Fax) [phone].     Please allow at least 3 business days for refill requests.     For urgent issues after business hours/weekends/holidays call [phone] and request for the outpatient internal medicine physician to be paged .    We offer same day appointments for your acute health concerns. These appointments are on a first come, first serve basis.     Please call [phone] if you would like to make an appointment. If I am not available, you can see any of my partners or try to see me the next day (call at 8am).     See you in -- wks/months     Orders Placed This Encounter    Tdap VACCINE >7YO IM    PSA SCREEN    COMPREHENSIVE METABOLIC PANEL    LIPID PROFILE    TSH WITH FREE T4 REFLEX    levothyroxine (SYNTHROID) 125 mcg tablet    atorvastatin (LIPITOR) 40 mg tablet    chlorthalidone (HYGROTON) 25 mg tablet    atenolol (TENORMIN) 25 mg tablet      Take care,    Dr.Karmello Abercrombie & Sam

## 2021-01-20 NOTE — Progress Notes
Date of Service: 01/20/2021    Subjective:             Jeffrey Sanders is a 61 y.o. male.  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 Royce Macadamia and HTN, hyperlipidemia presented for a annual physical.  He was last seen by me in July 2022.  He denied having any acute concerns in the interim.  Did point out to me about the right hand and finger which was bent and unable to straighten it all the way.  He has had recent eye and dental appointments done.  He reports consuming alcohol every evening.  On average he drinks about 6-8 beers per night.He quit cold [place] in 2013 for 3 years and for the past 3 years again he has been drinking heavily.  He is not interested in going to the alcohol Anonymous or alcohol abuse program.  He has been taking venlafaxine and refill has been sent recently.  We did talk about adequate control of the symptoms and his anxiety and depression or fair but would like that medication to be increased.  He has been taking atenolol and chlorthalidone for hypertension and atorvastatin for hyperlipidemia.  His last thyroid level was abnormal so TSH has been ordered and Synthroid has been provided.  He was seen by endocrinology externally.   History of Cowden's Dz (hamartoma syndrome), FAP - Familial Adenomatous Polyposis, or MEN syndrome: No  History of neck radiation exposure: No   Endocrinologist: Janyth Contes  Was also seen by pulmonary and had a CT scan for pulmonary nodules and follow-up in 12 months has been recommended no bronchodilators have been started based on his symptoms and also the PFTs.  Patient voiced understanding of all the recommendations denied having any other acute concerns at this point of time.        Medical History:   Diagnosis Date   ? Alcoholism (HCC)    ? Anxiety    ? Chest pain 03/26/2016   ? High blood pressure    ? Hypertension 03/26/2016   ? 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 Neoplasm of unspecified nature, site unspecified    ? 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 Ringing in the ears 12/27/2008   ? S/P radioactive iodine thyroid ablation    ? Unspecified endocrine disorder      Surgical History:   Procedure Laterality Date   ? ANGIOGRAPHY CORONARY ARTERY WITH LEFT HEART CATHETERIZATION WITH VENTRICULOGRAPHY Left 03/30/2016    Performed by Laney Pastor, MD at [facility] CATH LAB   ? POSSIBLE PERCUTANEOUS CORONARY STENT PLACEMENT WITH ANGIOPLASTY N/A 03/30/2016    Performed by Laney Pastor, MD at [facility] CATH LAB   ? COLONOSCOPY N/A 05/14/2016    Performed by Eliott Nine, MD at [facility] ENDO   ? COLONOSCOPY EXCISION LESION  05/14/2016    Performed by Eliott Nine, MD at [facility] ENDO   ? ESOPHAGOGASTRODUODENOSCOPY N/A 06/04/2016    Performed by Tempie Hoist, DO at [facility] ENDO   ? ESOPHAGOGASTRODUODENOSCOPY BIOPSY  06/04/2016    Performed by Tempie Hoist, DO at [facility] ENDO   ? HX THYROIDECTOMY     ? THYROIDECTOMY       Social History   ? Marital status: Single     Spouse name: N/A   ? Number of children: N/A   ? Years  of education: N/A     Occupational History   ? 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 ?He does have a history of smoking of 3 packs a day, he quit a few years ago. ?He currently chews tobacco. Has a history of significant alcohol use, he used to drink 14 -15 beers a night. ?Patient tells me that currently he drinks 8-10 beers every night.       Social History Main Topics   ? Smoking status: Former Smoker     Packs/day: 1.00     Years: 26.00     Types: Cigarettes     Quit date: 01/13/2008   ? Smokeless tobacco: Current User     Types: Chew      Comment: Counseling given 1.31.13   ? Alcohol use No   ? Drug use: No   ? Sexual activity: Not on file          Review of Systems   Constitutional: Negative for activity change, appetite change, fatigue, fever and unexpected weight change.   HENT: Negative for dental problem, sore throat, tinnitus, trouble swallowing and voice change.    Eyes: Negative for visual disturbance.   Respiratory: Negative for shortness of breath.    Cardiovascular: Negative for chest pain, palpitations and leg swelling.   Gastrointestinal: Negative for abdominal pain, constipation, diarrhea and nausea.   Genitourinary: Negative for difficulty urinating and frequency.   Musculoskeletal: Negative for arthralgias and joint swelling.   Neurological: Negative for dizziness, seizures and headaches.        Does report to me about possible alcohol withdrawal when he does not drink for a day.  He was constantly moving his feet during the appointment.   Hematological: Negative for adenopathy.   Psychiatric/Behavioral: Negative for behavioral problems, decreased concentration, dysphoric mood and sleep disturbance. The patient is not nervous/anxious.      Objective:         ? atenolol (TENORMIN) 25 mg tablet Take one tablet by mouth daily.   ? atorvastatin (LIPITOR) 40 mg tablet Take one tablet by mouth daily.   ? chlorthalidone (HYGROTON) 25 mg tablet Take one tablet by mouth daily.   ? levothyroxine (SYNTHROID) 125 mcg tablet Take one tablet by mouth daily 30 minutes before breakfast. Do NOT take on Sundays   ? other medication Apply one Dose topically to affected area daily. Medication Name & Strength: Testosterone Gel 2%  Apply 4 clicks ( ) topically daily   ? sildenafiL (VIAGRA) 25 mg tablet Take one tablet by mouth as Needed for Erectile dysfunction. Max of 1 tablet per day.   ? venlafaxine XR (EFFEXOR XR) 150 mg capsule Take two capsules by mouth daily for 180 days. Take with food.     Vitals:    01/20/21 1336   BP: 135/78   Pulse: 84   Resp: 16   PainSc: Zero   Weight: 75.2 kg (165 lb 12.8 oz)   Height: 172.7 cm (5' 8)     Body mass index is 25.21 kg/m?Marland Kitchen     Physical Exam  General Appearance: normal in appearance  Skin: warm, moist,   Eyes: conjunctivae mildly red and  pupils are equal and round  Respiratory Effort: breathing comfortably, no respiratory distress  Cardiac Rhythm: regular rhythm and normal rate  Lower Extremity Edema: no lower extremity edema  Abdominal Exam: soft, non-tender, no masses, bowel sounds normal  Neurologic Exam: neurological assessment grossly intact   Psych: Mood appropriate.  Assessment and  Plan:  Keaten is a 61 year old gentleman who presented for annual physical.    Age-appropriate counseling has been provided and fasting blood work has been ordered.    Right mallet finger supportive care along with a splint has been recommended and information has been provided.    Anxiety; counseling has been provided and increase the Effexor XR to 300 mg every day.Lexapro and buspar has been tried in the past.     History of T2, NX, MX papillary thyroid cancer ,left-followed closely by Endocrinology.  [place] FNA 06/2009 - No lesional tissue identified, Thyroglobulin AB <20. Non detectable last year Tgb recently 02/04/10. [place] L with stable2+cm remnant visible. FollOWs with Zhou, [place] T and Tgb panel ,TSH lower normal recently.On 125 mcg on synthroid.  TSH is being ordered as he has not been following with his other endocrinologist.    Hypertension Management: Continue on chlorthalidone and atenolol He is followed by local cardiologist.  BP Readings from Last 3 Encounters:   01/20/21 135/78   07/24/20 125/80   01/25/20 (!) 140/94     Male hypogonadism;  Testosterone is not on the list of medications     Hyperlipidemia Management -continue on atorvastatin.  Lab Results   Component Value Date/Time    CHOL 202 (A) 01/26/2020 12:00 AM    TRIG 86 01/26/2020 12:00 AM    HDL 59 01/26/2020 12:00 AM    LDL 103 01/26/2020 12:00 AM    VLDL 17 01/26/2020 12:00 AM    NONHDLCHOL 156 02/15/2018 10:59 AM    CHOLHDLC 3 01/26/2020 12:00 AM    ALT 64 (H) 07/24/2020 02:25 PM    CK 41 03/19/2016 12:00 AM    VITD25 >99.0 (H) 07/24/2020 02:25 PM        Imp: Hyperlipidemia fair     Plan:  Discussed labs and reviewed goals for LDL, HDL, triglycerides.   Discussed exercise management and diet with emphasis on vegetables, fruit and lean meat.  Are barriers to achieving goals present? No  Medication education provided. Patient voiced understanding? Yes  Patient able to self-manage and ready to comply?Yes  Educational resources identified? Verbal Counseling     Pulmonary nodules  CT scan pending for follow up .  PET scan and a CT scan of the lung has been reviewed.    PPSV 23 given on 04/15/16, will need PSV when age >5  ?  Insomnia; he had self discontinued trazodone in the past and has not been taking it.  Sleep hygiene has been discussed.  Alcohol cessation has been reinforced     Screening for prostate cancer - PSA is pending  HIV screening is negative  Hep C is negative    Immunization; Tdap is being forwarded today.       Patient Instructions     Mallet Finger  You have an injury to your finger causing the tip of your finger to droop down. This makes your finger look like a small hammer or mallet. This is why it?s given this name. It's also called baseball finger. This injury happens when the tendon that holds the finger straight at the last joint tears. Sometimes a small break happens where this tendon attaches to the bone. This causes local pain, swelling, and bruising. This injury usually takes about 4 to 6 weeks to heal. This injury is often treated with a special finger splint called a stack splint. It holds the tendon in the correct position. But even with the right treatment, it may not  be possible to fully straighten that joint after the injury heals. After healing, the joint may be stiff. But it usually recovers flexibility over time.   Home care  ? Keep your arm raised (elevated) to reduce pain and swelling. When sitting or lying down, elevate your arm above the level of your heart. You can do this by placing your arm on a pillow that rests on your chest, or on a pillow at your side. This is most important during the first 48 hours after the injury.  ? Put an ice pack over the injured area for 15 to 20 minutes every 3 to 6 hours. You should do this for the first 24 to 48 hours. You can make an ice pack by filling a plastic bag that seals at the top with ice cubes and then wrapping it with a thin towel. Be careful not to injure your skin with the ice treatments. Ice should never be applied directly to skin. Keep using ice packs to ease pain and swelling as needed. After 48 to 72 hours, or as directed by your healthcare provider, apply heat (warm shower or warm bath) for 15 to 20 minutes several times a day. Or switch between ice and heat.  ? If a splint was applied, keep it in place for the time advised. If you remove it too soon, it will cause the tendon's position to change and the finger joint will heal in a bent position.  ? You may use over-the-counter pain medicine to control pain, unless another pain medicine was prescribed. Talk with your healthcare provider before using these medicines if you have long-term (chronic) liver or kidney disease, ever had a stomach ulcer or gastrointestinal bleeding, or take blood thinners.  ? Most people can return to normal activity while wearing the splint. Discuss any limitations with your healthcare provider.     Follow-up care  Follow up with your healthcare provider as advised.   If X-rays were taken, you'll be told of any new findings that may affect your care.    When to get medical advice  Call your healthcare provider right away if any of the following occur:  ? Pain or swelling in the injured finger increases  ? The finger becomes red or warm   ? Injured finger becomes cold, blue, numb, or tingly  [employer] last reviewed this educational content on 04/12/2020  ? 2000-2022 [employer], [place]. All rights reserved. This information is not intended as a substitute for professional medical care. Always follow your healthcare professional's instructions.          Health Screening Guidelines, Men Ages 84 to 54   Screening tests and health counseling are a key part of managing your health. A screening test is done to find disorders or diseases in people who don't have any symptoms. Screening tests are not used to diagnose. They are used to find out if more testing is needed. The goal may be to find a disease early so it can be treated with more success. Or the goal may be to find a disease early so you can make lifestyle changes. You may need regular checkups to help you reduce your risk of disease.   Below are guidelines for men ages 51 to 80. Talk with your healthcare provider. Make sure you?re up-to-date on what you need.   We understand gender is a spectrum. We may use gendered terms to talk about anatomy and health risk. Please use this information in  a way that works best for you and your provider as you talk about your care.           Screening  Who needs it How often    Unhealthy alcohol use  All men in this age group  At routine exams   Blood pressure All men in this age group  Once a year if your blood pressure is normal. Normal blood pressure is less than 120/80 mm Hg. If your blood pressure is higher than this, follow the advice of your healthcare provider.    Colorectal cancer All men at average risk in this age group  Talk with your healthcare provider about which test below is right for you:   ? Colonoscopy every 10 years  ? Flexible sigmoidoscopy every 5 years (or every 10 years with yearly fecal immunochemical test (FIT) stool test)  ? CT colonography (virtual colonoscopy) every 5 years  ? Yearly fecal occult blood test  ? Yearly FIT  ? Stool DNA test every 1 to 3 years  If you have a test that is not a colonoscopy and have an abnormal test result, you will need a colonoscopy.   You may need to be screened more or less often. This is based on personal or family health history. Talk with your healthcare provider.    Depression All men in this age group  At routine exams   Type 2 diabetes or prediabetes  All in this age group  At least every 3 years (yearly if your blood sugar has already begun to rise)    Type 2 diabetes All men with prediabetes  Every year   Hepatitis C Men at higher risk for infection. Test 1 time for men born between [birth year] and 1965.  Talk with your healthcare provider.    High cholesterol or triglycerides  All men in this age group  At least every 4 to 6 years. Talk with your healthcare provider about your risk. Ask if you should be tested more often.    HIV All men in this age group  At least 1 time. Talk with your healthcare provider about your risk factors. Ask if you should be tested more often.    Lung cancer All men in this age group who are in fairly good health and are at higher risk for lung cancer, and who:   ? Smoke or quit in the past 15 years  ? Have a 20-pack per year smoking history (1 pack a day for 20 years or 2 packs a day for 10 years)  Expert groups vary in their advice. Talk with your healthcare provider.  Yearly lung cancer screening with a low-dose CT scan (LDCT). Talk with your healthcare provider.    Obesity All men in this age group  At yearly routine exams    BMI (body mass index) All men in this age group Every year, to help find out if you are at a healthy weight for your height    Prostate cancer All men in this age group, talk with your healthcare provider about risks and benefits of a digital rectal exam (DRE) and prostate-specific antigen (PSA) screening  At routine exams if you decide to be tested.    Syphilis Men at higher risk for infection  At routine exams. Talk with your healthcare provider.    Tuberculosis Men at higher risk for infection  Talk with your healthcare provider    Vision All men in this age group  Talk  with your healthcare provider   Health counseling  Who needs it  How often     Diet and exercise Men who are overweight or obese  When diagnosed, and then at routine exams     Sexually transmitted infection (STI) prevention  Men at higher risk for infection  At routine exams; talk with your healthcare provider     Use of daily aspirin  All men in this age group who are at high risk for cardiovascular problems and not at a higher risk for bleeding. Talk with your healthcare provider.  At routine exams, talk with your healthcare provider about the risks and benefits for you.     Use of statin medicines for cholesterol All men in this age group who have:   ? An LDL-C level of more than 70 mg/dL but less than 109 mg/dL, no diabetes and borderline to high level of risk  ? An LDL-C level of 190 mg/dL or higher  ? A diagnosis of diabetes and LDL-C level of higher than 70mg /dL At routine exams. More often if directed by your healthcare provider. Talk with your healthcare provider about your risk factors.     Use of tobacco and the health effects it can cause  All men in this age group  Every exam        Routine Clinic Information:     Please don't hesitate to call if you have any problems or questions. Sam can be reached at [phone].     You may also message [place] in MyChart.     For refills on medications, please have your pharmacy fax a refill authorization request form to our office at Fax) [phone].     Please allow at least 3 business days for refill requests.     For urgent issues after business hours/weekends/holidays call [phone] and request for the outpatient internal medicine physician to be paged .    We offer same day appointments for your acute health concerns. These appointments are on a first come, first serve basis.     Please call [phone] if you would like to make an appointment. If I am not available, you can see any of my partners or try to see me the next day (call at 8am).     See you in  6 months     Orders Placed This Encounter   ? Tdap VACCINE >7YO IM   ? PSA SCREEN   ? COMPREHENSIVE METABOLIC PANEL   ? LIPID PROFILE   ? TSH WITH FREE T4 REFLEX   ? levothyroxine (SYNTHROID) 125 mcg tablet   ? atorvastatin (LIPITOR) 40 mg tablet   ? chlorthalidone (HYGROTON) 25 mg tablet   ? atenolol (TENORMIN) 25 mg tablet      Take care,    Dr.Nixxon Faria & Sam         *Dictated with Animal nutritionist, please forgive typos    Total of 40 minutes were spent on the same day of the visit including preparing to see the patient, obtaining and/or reviewing separately obtained history, performing a medically appropriate examination and/or evaluation, counseling and educating the patient/family/caregiver, ordering medications, tests, or procedures, referring and communication with other health care professionals, documenting clinical information in the electronic or other health record, independently interpreting results and communicating results to the patient/family/caregiver, and care coordination.

## 2021-01-30 ENCOUNTER — Encounter: Admit: 2021-01-30 | Discharge: 2021-01-30 | Payer: BC Managed Care – HMO

## 2021-01-30 DIAGNOSIS — I10 Essential (primary) hypertension: Secondary | ICD-10-CM

## 2021-01-30 DIAGNOSIS — Z125 Encounter for screening for malignant neoplasm of prostate: Secondary | ICD-10-CM

## 2021-01-30 DIAGNOSIS — E785 Hyperlipidemia, unspecified: Secondary | ICD-10-CM

## 2021-01-30 DIAGNOSIS — F419 Anxiety disorder, unspecified: Secondary | ICD-10-CM

## 2021-01-30 LAB — COMPREHENSIVE METABOLIC PANEL
ALBUMIN: 5
ALK PHOSPHATASE: 100
ALT: 72 — AB (ref 4–49.9)
ANION GAP: 6 — AB (ref 8–16)
AST: 67 — AB (ref 17–59)
BLD UREA NITROGEN: 13
CALCIUM: 9.5
CHLORIDE: 102
CREATININE: 0.8
GFR ESTIMATED: 105
GLUCOSE,PANEL: 131 — AB (ref 74–100)
POTASSIUM: 3.8
SODIUM: 139
TOTAL BILIRUBIN: 0.6
TOTAL PROTEIN: 7.9

## 2021-01-30 LAB — LIPID PROFILE
CHOLESTEROL/HDL %: 2
CHOLESTEROL: 173
HDL: 73
LDL: 69
TRIGLYCERIDES: 76
VLDL: 15

## 2021-01-30 LAB — PSA SCREEN: PROSTATIC SPEC AG: 2.1 (ref 0.00–4.00)

## 2021-01-30 LAB — TSH WITH FREE T4 REFLEX: TSH: 0.4 pg — ABNORMAL LOW (ref 0.47–4.68)

## 2021-02-17 ENCOUNTER — Encounter: Admit: 2021-02-17 | Discharge: 2021-02-17 | Payer: BC Managed Care – HMO

## 2021-02-17 ENCOUNTER — Emergency Department: Admit: 2021-02-17 | Discharge: 2021-02-18 | Payer: BC Managed Care – HMO

## 2021-02-17 ENCOUNTER — Emergency Department: Admit: 2021-02-17 | Discharge: 2021-02-17 | Payer: BC Managed Care – HMO

## 2021-02-17 DIAGNOSIS — R079 Chest pain, unspecified: Secondary | ICD-10-CM

## 2021-02-17 DIAGNOSIS — Z923 Personal history of irradiation: Secondary | ICD-10-CM

## 2021-02-17 DIAGNOSIS — D499 Neoplasm of unspecified behavior of unspecified site: Secondary | ICD-10-CM

## 2021-02-17 DIAGNOSIS — I10 Essential (primary) hypertension: Secondary | ICD-10-CM

## 2021-02-17 DIAGNOSIS — C73 Malignant neoplasm of thyroid gland: Secondary | ICD-10-CM

## 2021-02-17 DIAGNOSIS — Z789 Other specified health status: Secondary | ICD-10-CM

## 2021-02-17 DIAGNOSIS — F1020 Alcohol dependence, uncomplicated: Secondary | ICD-10-CM

## 2021-02-17 DIAGNOSIS — E349 Endocrine disorder, unspecified: Secondary | ICD-10-CM

## 2021-02-17 DIAGNOSIS — R0609 Other forms of dyspnea: Secondary | ICD-10-CM

## 2021-02-17 DIAGNOSIS — R42 Dizziness and giddiness: Secondary | ICD-10-CM

## 2021-02-17 DIAGNOSIS — H9319 Tinnitus, unspecified ear: Secondary | ICD-10-CM

## 2021-02-17 DIAGNOSIS — E042 Nontoxic multinodular goiter: Secondary | ICD-10-CM

## 2021-02-17 DIAGNOSIS — F419 Anxiety disorder, unspecified: Secondary | ICD-10-CM

## 2021-02-17 LAB — BNP POC ER: BNP POC: <15 pg/mL (ref 0–100)

## 2021-02-17 LAB — CBC AND DIFF
ABSOLUTE BASO COUNT: 0 K/UL (ref 0–0.20)
ABSOLUTE EOS COUNT: 0 K/UL (ref 0–0.45)
ABSOLUTE MONO COUNT: 0.5 K/UL (ref >60)
BASOPHILS %: 0 % (ref 0–2)
MCV: 97 FL (ref 80–100)
MDW (MONOCYTE DISTRIBUTION WIDTH): 17 (ref <20.7)
RBC COUNT: 4.8 M/UL (ref 4.4–5.5)
RDW: 12 % (ref 11–15)
WBC COUNT: 6 K/UL (ref 4.5–11.0)

## 2021-02-17 LAB — INFLUENZA A/B AND RSV PCR
FLU A: NEGATIVE FL — ABNORMAL HIGH (ref 7–11)
FLU B: NEGATIVE mg/dL (ref 0.3–1.2)

## 2021-02-17 LAB — COVID-19 (SARS-COV-2) PCR

## 2021-02-17 LAB — COMPREHENSIVE METABOLIC PANEL
ALBUMIN: 5 g/dL — ABNORMAL LOW (ref 3.5–5.0)
ALK PHOSPHATASE: 88 U/L (ref 25–110)
ALT: 55 U/L (ref 7–56)
ANION GAP: 12 K/UL (ref 3–12)
AST: 40 U/L (ref 7–40)
BLD UREA NITROGEN: 14 mg/dL (ref 7–25)
GLUCOSE,PANEL: 97 mg/dL (ref 70–100)
POTASSIUM: 3.9 MMOL/L (ref 3.5–5.1)
SODIUM: 141 MMOL/L (ref 137–147)

## 2021-02-17 LAB — D-DIMER: D-DIMER: 232 ng{FEU}/mL (ref <500)

## 2021-02-17 LAB — POC TROPONIN: TROPONIN I, POC: 0 ng/mL (ref 0.00–0.05)

## 2021-02-17 MED ORDER — LACTATED RINGERS IV SOLP
1000 mL | INTRAVENOUS | 0 refills | Status: CP
Start: 2021-02-17 — End: ?
  Administered 2021-02-17: 23:00:00 1000 mL via INTRAVENOUS

## 2021-02-17 MED ADMIN — LACTATED RINGERS IV SOLP [4318]: 1000 mL | INTRAVENOUS | Stop: 2021-02-17 | NDC 00338011704

## 2021-02-17 NOTE — ED Provider Notes
Jeffrey Sanders is a 61 y.o. male.    Chief Complaint:  Chief Complaint   Patient presents with   ? Shortness of Breath     With strenuous activity. Near syncopal episode 2 weeks ago d/t being so short of breath. Denies CP, cough.   Pt drinks 8 beers a night. Last drink yesterday. Denies withdrawal hx, however tachy, hypertensive, and tremors present in triage        History of Present Illness:  Jeffrey Sanders is a 61 y.o. male with a past medical history of thyroid cancer s/p thyroidectomy & RAI, hypertension, alcoholism who presents to the emergency department with shortness of breath.  Patient states for the past 2 weeks, he has had significant shortness of breath with strenuous activity that seems to be getting worse.  At rest he feels fine.  He has also been having lightheadedness with activity, and had a near syncopal episode 2 weeks ago.  He denies chest pain, palpitations, fever, chills, productive cough, nausea, vomiting, diarrhea.  Patient does not have a history of blood clots.  Patient drinks 8 beers a night and state he has never had withdrawal symptoms or withdrawal seizures.  States he is pretty anxious at baseline.  Believes his symptoms could be related to increasing his dose of venlafaxine this past summer. Patient is tachycardic in 100s-130s in room, rhythm appears sinus. He is asymptomatic at this time.          Review of Systems:  Review of Systems   Constitutional: Negative for chills and fever.   HENT: Negative for sore throat.    Eyes: Negative for visual disturbance.   Respiratory: Positive for shortness of breath. Negative for cough.    Cardiovascular: Negative for chest pain, palpitations and leg swelling.   Gastrointestinal: Negative for abdominal pain, diarrhea, nausea and vomiting.   Genitourinary: Negative for dysuria.   Musculoskeletal: Negative for back pain.   Skin: Negative for rash.   Neurological: Positive for light-headedness. Negative for syncope and headaches. Psychiatric/Behavioral: Negative for agitation and confusion.       Allergies:  Patient has no known allergies.    Past Medical History:  Medical History:   Diagnosis Date   ? Alcoholism (HCC)    ? Anxiety    ? Chest pain 03/26/2016   ? High blood pressure    ? Hypertension 03/26/2016   ? 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 Neoplasm of unspecified nature, site unspecified    ? 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 Ringing in the ears 12/27/2008   ? S/P radioactive iodine thyroid ablation    ? Unspecified endocrine disorder        Past Surgical History:  Surgical History:   Procedure Laterality Date   ? ANGIOGRAPHY CORONARY ARTERY WITH LEFT HEART CATHETERIZATION WITH VENTRICULOGRAPHY Left 03/30/2016    Performed by Laney Pastor, MD at [facility] CATH LAB   ? POSSIBLE PERCUTANEOUS CORONARY STENT PLACEMENT WITH ANGIOPLASTY N/A 03/30/2016    Performed by Laney Pastor[facility] MD [facility] CATH LAB   ? COLONOSCOPY N/A 05/14/2016    Performed by Eliott Nine, MD at [facility] ENDO   ? COLONOSCOPY EXCISION LESION  05/14/2016    Performed by Eliott Nine, MD at [facility] ENDO   ? ESOPHAGOGASTRODUODENOSCOPY N/A 06/04/2016    Performed by Tempie Hoist, DO at [facility] ENDO   ? ESOPHAGOGASTRODUODENOSCOPY BIOPSY  06/04/2016    Performed by Tempie Hoist, DO at [facility] ENDO   ?  HX THYROIDECTOMY     ? THYROIDECTOMY         Pertinent medical/surgical history reviewed    Social History:  Social History     Tobacco Use   ? Smoking status: Former     Packs/day: 1.00     Years: 26.00     Pack years: 26.00     Types: Cigarettes     Quit date: 01/13/2008     Years since quitting: 13.1   ? Smokeless tobacco: Current     Types: Chew   ? Tobacco comments:     Counseling given 1.31.13   Substance Use Topics   ? Alcohol use: Yes     Comment: 8 beers a night    ? Drug use: No     Social History     Substance and Sexual Activity   Drug Use No             Family History:  Family History   Problem Relation Age of Onset   ? Hypertension Mother    ? Cancer Father    ? Kidney Disease Father    ? Other Father    ? Cancer Paternal Uncle    ? Diabetes Paternal Grandmother        Vitals:  ED Vitals    Date and Time T BP P RR SPO2P SPO2 User   02/17/21 1851 --  146/87  91 17 PER MINUTE 93 98 % [place]   02/17/21 1850 --  134/76  87 14 PER MINUTE 87 98 % [place]   02/17/21 1830 -- 135/78 92 14 PER MINUTE 92 97 % [place]   02/17/21 1729 --  128/82  109 15 PER MINUTE 109 98 % AI   02/17/21 1728 --  131/76  88 15 PER MINUTE 88 98 % AI   02/17/21 1532 --  145/95 138 21 PER MINUTE  141 97 % [place]   02/17/21 1529 --  138/100  96 23 PER MINUTE  95 98 % [place]   02/17/21 1437 37.3 ?C (99.1 ?F) 135/80 100 18 PER MINUTE 100 98 % BS   02/17/21 1259 37.2 ?C (99 ?F) 150/93 118 18 PER MINUTE 118 -- ST          Physical Exam:  Physical Exam  Vitals and nursing note reviewed.   Constitutional:       General: He is not in acute distress.     Appearance: Normal appearance. He is normal weight. He is not toxic-appearing.   HENT:      Head: Normocephalic and atraumatic.   Eyes:      Extraocular Movements: Extraocular movements intact.      Conjunctiva/sclera: Conjunctivae normal.   Cardiovascular:      Rate and Rhythm: Regular rhythm. Tachycardia present.      Pulses: Normal pulses.      Heart sounds: Normal heart sounds.   Pulmonary:      Effort: Pulmonary effort is normal. No respiratory distress.      Breath sounds: Normal breath sounds. No wheezing or rales.   Abdominal:      General: Bowel sounds are normal. There is no distension.      Palpations: Abdomen is soft.      Tenderness: There is no abdominal tenderness. There is no right CVA tenderness or left CVA tenderness.   Musculoskeletal:         General: Normal range of motion.      Cervical back: Normal range of  motion and neck supple.      Right lower leg: No edema.      Left lower leg: No edema.   Skin:     General: Skin is warm and dry.      Capillary Refill: Capillary refill takes less than 2 seconds.   Neurological:      Mental Status: He is oriented to person, place, and time. Mental status is at baseline.      Motor: No weakness.      Comments: Patient has subtle tremors in bilateral hands   Psychiatric:         Mood and Affect: Mood normal.         Behavior: Behavior normal.         Laboratory Results:  Labs Reviewed   CBC AND DIFF - Abnormal       Result Value Ref Range Status    White Blood Cells 6.0  4.5 - 11.0 K/UL Final    RBC 4.82  4.4 - 5.5 M/UL Final    Hemoglobin 16.2  13.5 - 16.5 GM/DL Final    Hematocrit 08.1  40 - 50 % Final    MCV 97.0  80 - 100 FL Final    MCH 33.6  26 - 34 PG Final    MCHC 34.7  32.0 - 36.0 G/DL Final    RDW 44.8  11 - 15 % Final    Platelet Count 181  150 - 400 K/UL Final    MPV 8.6  7 - 11 FL Final    Neutrophils 73  41 - 77 % Final    Lymphocytes 18 (*) 24 - 44 % Final    Monocytes 9  4 - 12 % Final    Eosinophils 0  0 - 5 % Final    Basophils 0  0 - 2 % Final    Absolute Neutrophil Count 4.41  1.8 - 7.0 K/UL Final    Absolute Lymph Count 1.06  1.0 - 4.8 K/UL Final    Absolute Monocyte Count 0.54  0 - 0.80 K/UL Final    Absolute Eosinophil Count 0.00  0 - 0.45 K/UL Final    Absolute Basophil Count 0.02  0 - 0.20 K/UL Final    MDW (Monocyte Distribution Width) 17.0  <20.7 Final   COMPREHENSIVE METABOLIC PANEL - Abnormal    Sodium 141  137 - 147 MMOL/L Final    Potassium 3.9  3.5 - 5.1 MMOL/L Final    Chloride 101  98 - 110 MMOL/L Final    Glucose 97  70 - 100 MG/DL Final    Blood Urea Nitrogen 14  7 - 25 MG/DL Final    Creatinine 1.85  0.4 - 1.24 MG/DL Final    Calcium 63.1  8.5 - 10.6 MG/DL Final    Total Protein 8.2 (*) 6.0 - 8.0 G/DL Final    Total Bilirubin 0.6  0.3 - 1.2 MG/DL Final    Albumin 5.0  3.5 - 5.0 G/DL Final    Alk Phosphatase 88  25 - 110 U/L Final    AST (SGOT) 40  7 - 40 U/L Final    CO2 28  21 - 30 MMOL/L Final    ALT (SGPT) 55  7 - 56 U/L Final    Anion Gap 12  3 - 12 Final    eGFR >60  >60 mL/min Final   COVID-19 (SARS-COV-2) PCR    COVID-19 (SARS-CoV-2) PCR Source  Final    Value: FLOCKED SWAB  NASOPHARYNGEAL COVID-19 (SARS-CoV-2) PCR NOT DETECTED  DN-NOT DETECTED Final   INFLUENZA A/B AND RSV PCR    Influenza A Virus NEG  NEG-NEG Final    Influenza B Virus NEG  NEG-NEG Final    RSV NEG  NEG-NEG Final   TROPONIN-I    Troponin-I 0.00  0.0 - 0.05 NG/ML Final   D-DIMER    D-Dimer 232  <500 ng/mL FEU Final   BNP POC ER    BNP POC <15.0  0 - 100 PG/ML Final   POC TROPONIN    Troponin-I-POC 0.00  0.00 - 0.05 NG/ML Final   POC TROPONIN   POC BNP          Radiology Interpretation:    CHEST 2 VIEWS   Final Result         No acute cardiopulmonary abnormality.          Finalized by Golda Acre, M.D. on 02/17/2021 3:59 PM. Dictated by Golda Acre, M.D. on 02/17/2021 3:58 PM.         POC ED [place] CARDIAC LIMITED    (Results Pending)         EKG:  ECG Results          [facility] ED MAIN ECG TRIAGE ONLY (Final result)      Collection Time Result Time VT RATE P-R Interval QRS DURATION Q-T Interval QTC Calc [place]    02/17/21 12:58:58 02/17/21 15:35:10 116 170 74 310 430         Collection Time Result Time P Axis R Axis T Axis    02/17/21 12:58:58 02/17/21 15:35:10 75 70 63               Final result                 Impression:    Sinus tachycardia  Otherwise normal ECG  No previous ECGs available in MUSE  Confirmed by Dangers, Jonathan ([id number]) on 02/17/2021 3:35:07 PM                              Medical Decision Making:  Jeffrey Sanders is a 61 y.o. male who presents with chief complaint as listed above. Based on the history and presentation, the list of differential diagnoses considered included, but was not limited to, pulmonary embolism, aortic stenosis vs. Other valvular pathology, pneumothorax, pneumonia, URI, paroxysmal SVT vs other tachyarrhythmia, alcohol withdrawal symptoms.    ED Course  Patient is a 61 y.o. male presenting with the chief complaint of shortness of breath and lightheadedness with activity x2 weeks. No episodes of full syncope. Denies chest pain, cough, fever, chills.  Pt seen and evaluated by resident and attending at bedside.  Previous records reviewed.   Patient's initial vitals reviewed. Noted to be tachycardic and mildly hypertensive.  PIV placed, labs drawn, patient on monitor.  Course  -Physical exam significant for the findings above  -Labs, EKG, CXR, POC echo, swabs ordered for further evaluation of patient symptoms  -Patient given 1L LR for postural tachycardia and likely dehydration.  Patient's pulse rose from 100 to mid 130s upon standing.  -EKG demonstrated sinus tachycardia, chest x-ray demonstrated no acute cardiopulmonary abnormalities, and lab troponin 0.00, negative D-dimer, negative COVID, negative flu/RSV, no leukocytosis, no anemia, CMP within normal limits.  -Patient still having postural tachycardia, given a second liter of fluids.  -After second liter, patient no  longer having tachycardia pulse sitting at 80s upon standing.  Patient gait tested, pulse remained within normal range and patient did not have O2 saturation drops.  Safe for discharge.  Ordered follow-up in cardiology clinic.  Patient comfortable with plan.  All questions answered.  Return precautions discussed.  Counseled on alcohol cessation.  Disposition: Discharge             Complexity of Problems Addressed  Patient's active diagnoses as well as contributing pre-existing medical problems include:  Clinical Impression   Dyspnea on exertion   Lightheadedness   Alcohol use     Evaluation performed for severe exacerbation, progression, or side effect of treatment during this visit given the initial differential diagnosis as discussed previously in MDM/ED course.     Additional data reviewed:    ? History was obtained from an independent historian: Not in addition to what is mentioned above  ? Prior non-ED notes reviewed: Not in addition to what is mentioned above  ? Independent interpretation of diagnostic tests was performed by me: X-Ray: CXR demonstrated no acute cardiopulmonary finding  ? Patient presentation/management was discussed with the following qualified health care professionals and/or other relevant professionals: Not in addition to what is mentioned above    Risk evaluation:    ? Diagnosis or treatment of patient condition impacted by social determinant of health: None  ? Tests Considered but not performed due to clinical scoring (if not mentioned in ED course, aside from what is implied by clinical scores listed): CTA chest, patient had negative d-dimer  ? Rationale regarding whether admission or escalation of care considered if not performed (if not mentioned in ED course, aside from what is implied by clinical scores listed): Patient is asymptomatic at this time, vitals have improved with 2L fluids, and he is willing to follow-up outpatient. Workup largely negative.    ED Scoring:                      HEART Total Score: 2  Low (0-3 points), with 0.9-1.7% risk over the next 6 weeks.  Moderate (4-7 points), with 12.0-16.6% risk over the next 6 weeks.  High (8-10 points), with 5--65% risk over the next 6 weeks.         Facility Administered Meds:  Medications   lactated ringers infusion (0 mL Intravenous Infusion Stopped 02/17/21 1855)       Clinical Impression:  Clinical Impression   Dyspnea on exertion   Lightheadedness   Alcohol use       Disposition/Follow up  ED Disposition     ED Disposition   Discharge           Cardiology: [facility]  [address]  [place], [id number]  [place] [address]  [phone]  Call   If you do not hear from them by Friday    Verneda Skill, MD  [address]  Ortho/Med [address]  [place] [address]  [phone]    Call   As needed      Medications:  New Prescriptions    No medications on file       Procedure Notes:  Procedures       Attestation / Supervision:  Sharion Balloon, MD  Emergency Medicine, PGY-1

## 2021-02-17 NOTE — ED Notes
Pt is a 61 y.o. male who came into the ED today with a CC of SOB with strenuous activity. Patient reports this has been occurring for a week and denies any associated CP. Pt states he had near syncope episode earlier this week while lifting a heavy object at work. Pt reports he has 8-9 beers every night. Pt is tachycardic upon arrival and hypertensive. Pt denies CP, SOB, N/V/D, constipation, abdominal pain, and fever/chills. Pt is A&Ox4, VSS, breathing is non-labored, skin is appropriate to age and ethnicity. Pt placed on cardiac monitor, resting in bed, locked in lowest position, side rails up, call light within reach.     Belongings: shirt, pants, shoes

## 2021-02-18 ENCOUNTER — Encounter: Admit: 2021-02-18 | Discharge: 2021-02-18 | Payer: BC Managed Care – HMO

## 2021-02-18 NOTE — Unmapped
You were seen and evaluated in the emergency department for shortness of breath and lightheadedness.  Your physical exam was reassuring, your labs and imaging were also reassuring.  We did not find signs of blood clot in your lungs, heart attack, pneumonia, or other emergent causes of your symptoms.  An order has been placed for you to get follow-up in the cardiology clinic.  If you not hear from them by Friday, give them a call.  Their number is attached to this packet.  Please return to the emergency department if you develop increasing shortness of breath, severe chest pain, passing out, fever/chills, or any other concerning symptoms.

## 2021-02-18 NOTE — ED Notes
Pt ambulated down hallway without SOB and maintained a HR of 105's. Dr. Glory Buff updated at this time.

## 2021-02-18 NOTE — Telephone Encounter
ED Discharge Follow Up  Reached patient: Yes  Admission Information  Hospital Name : [facility]  ED Admission Date: 02/17/21 ED Discharge Date: 02/17/21 Admission Diagnosis: Shortness of breath / cough  Discharge Diagnosis   Dyspnea on exertion   Lightheadedness   Alcohol use   Hospital Services: Unplanned  Today's call is 1 (calendar) days post discharge    Discharge Instruction Review  Did patient receive and understand discharge instructions? Yes  Are there concerns regarding the patient?s ADL?s ? No    Medication Reconciliation  Changes to pre-ED visit medications? No  Were new prescriptions filled?N/A  Meds reviewed and reconciled? Yes  ? atenolol (TENORMIN) 25 mg tablet Take one tablet by mouth daily.   ? atorvastatin (LIPITOR) 40 mg tablet Take one tablet by mouth daily.   ? chlorthalidone (HYGROTON) 25 mg tablet Take one tablet by mouth daily.   ? levothyroxine (SYNTHROID) 125 mcg tablet Take one tablet by mouth daily 30 minutes before breakfast. Do NOT take on Sundays   ? other medication Apply one Dose topically to affected area daily. Medication Name & Strength: Testosterone Gel 2%  Apply 4 clicks ( ) topically daily   ? sildenafiL (VIAGRA) 25 mg tablet Take one tablet by mouth as Needed for Erectile dysfunction. Max of 1 tablet per day.   ? venlafaxine XR (EFFEXOR XR) 150 mg capsule Take two capsules by mouth daily for 180 days. Take with food.         Understanding Condition  Having any current symptoms? No; he states he is doing okay.  He denies any SOA today, but states he has not done any strenuous activity.  ED ordered referral to Cardiology, and he is waiting for call. He declined f/u with PCP office.  Patient understands when to seek additional medical care? Yes  Is patient aware of [facility] locations?Yes  Urgent Care appropriate for this diagnosis? No  Other instructions provided:     Scheduling Follow-up Appointment  Upcoming appointment date and time and with whom scheduled:   Future Appointments   Date Time Provider Department Center   07/21/2021  1:40 PM Verneda Skill, MD [facility]     When was patient?s last PCP visit: 01/20/2021  PCP primary location: [facility]  PCP appointment scheduled? No, patient declined appointment  Specialist appointment scheduled? No  Both PCP and Specialist appointment scheduled: No  Is assistance with transportation needed? No  MyChart message sent? Active in MyChart. No message sent.     ED Communication   Did Pt call Clinic prior to going to ED? No  Reason patient went to ED: Fear of having a critical medical condition    Lavell Islam

## 2021-02-18 NOTE — Telephone Encounter
Patient had called and LVM for me late last night. He is wanting to get off of the Effexor. Advised he would need to come in for an appointment which he understands. He would like to do it the same day he has an appointment with cardiology since he lives far away. I told him to call me when he gets that appointment with him and I will try to work him in. Patient knows not to stop the medication or miss any doses.

## 2021-02-18 NOTE — ED Notes
Pt educated on discharge instructions, home care, and follow up care. Pt verbalized understanding and had all questions answered at this time. Pt ambulated out of ED with a steady gait and all belongings.

## 2021-02-19 ENCOUNTER — Encounter: Admit: 2021-02-19 | Discharge: 2021-02-19 | Payer: BC Managed Care – HMO

## 2021-02-19 MED ORDER — VENLAFAXINE 150 MG PO CP24
150 mg | ORAL_CAPSULE | Freq: Every day | ORAL | 0 refills | Status: AC
Start: 2021-02-19 — End: ?

## 2021-03-07 ENCOUNTER — Encounter: Admit: 2021-03-07 | Discharge: 2021-03-07 | Payer: BC Managed Care – HMO

## 2021-03-07 DIAGNOSIS — F1020 Alcohol dependence, uncomplicated: Secondary | ICD-10-CM

## 2021-03-07 DIAGNOSIS — H9319 Tinnitus, unspecified ear: Secondary | ICD-10-CM

## 2021-03-07 DIAGNOSIS — R0602 Shortness of breath: Secondary | ICD-10-CM

## 2021-03-07 DIAGNOSIS — I2510 Atherosclerotic heart disease of native coronary artery without angina pectoris: Secondary | ICD-10-CM

## 2021-03-07 DIAGNOSIS — F419 Anxiety disorder, unspecified: Secondary | ICD-10-CM

## 2021-03-07 DIAGNOSIS — E785 Hyperlipidemia, unspecified: Secondary | ICD-10-CM

## 2021-03-07 DIAGNOSIS — R079 Chest pain, unspecified: Secondary | ICD-10-CM

## 2021-03-07 DIAGNOSIS — I10 Essential (primary) hypertension: Secondary | ICD-10-CM

## 2021-03-07 DIAGNOSIS — E349 Endocrine disorder, unspecified: Secondary | ICD-10-CM

## 2021-03-07 DIAGNOSIS — Z136 Encounter for screening for cardiovascular disorders: Secondary | ICD-10-CM

## 2021-03-07 DIAGNOSIS — D499 Neoplasm of unspecified behavior of unspecified site: Secondary | ICD-10-CM

## 2021-03-07 DIAGNOSIS — E042 Nontoxic multinodular goiter: Secondary | ICD-10-CM

## 2021-03-07 DIAGNOSIS — C73 Malignant neoplasm of thyroid gland: Secondary | ICD-10-CM

## 2021-03-07 DIAGNOSIS — Z923 Personal history of irradiation: Secondary | ICD-10-CM

## 2021-03-07 NOTE — Progress Notes
Date of Service: 03/07/2021    Jeffrey Sanders is a 61 y.o. male.       HPI     Mr. Jeffrey Sanders was seen in our office today for follow up after a recent emergencey department visit.  He is a 61 year old male followed in office by Dr. Nickolas Madrid.  The patient has a medical history significant for coronary artery disease, hypertension, hyperlipidemia, thyroid cancer s/p thyroidectomy and RAI, alcoholism, anxiety.  He was evaluated with a left heart catheterization on 03/30/2016, he was found to have moderate disease manifested by 40 to 50% mid LAD stenosis.    He has hypertension and is currently treated with atenolol and chlorthalidone.    He was last evaluated in our office on 05/17/2018 via a video telehealth visit in our [place] clinic.  Records indicate the patient was not taking his atorvastatin regularly.  We will cholesterol was 151 from a lipid profile dated 02/15/2018.  He was asked to continue all of his current medications and encourage compliance with statin therapy and his other medications.  He was recommended to have a 2D echo Doppler study in approximately 2 to 3 months and to follow back up in our office in approximately 6 months.    A 2D echo Doppler performed on 07/22/2018 normal LV systolic function with EF 57%.  Normal diastolic function.  Normal right ventricular size and systolic function.  Normal biatrial size.  No significant valvular abnormalities.  The PA systolic pressure was unable to be evaluated with the study due to inadequate tricuspid regurgitation signal.    Mr. Jeffrey Sanders was evaluated in the emergency department at [facility] on 02/17/2021 for 2-week history of exertional shortness of breath with strenuous activity which is progressively getting worse lightheadedness.  Not having shortness of breath at rest.  Also noted to have some lightheadedness with activity and had 2 near syncopal episodes 2 weeks prior.  He denied having chest pain.  Patient reported that he believed his symptoms could be related to increasing his dose of venlafaxine this past summer.  He was tachycardic with heart rates in the 100s to 130s  He was asymptomatic in the ED.  Blood pressure was initially elevated at 150/93 and came down to 134/76 by the time he was discharged from the ED.   EKG demonstrated sinus tachycardia, chest x-ray demonstrated no acute cardiopulmonary abnormalities, and lab troponin negative x 2, negative D-dimer, negative COVID, negative flu/RSV, no leukocytosis, no anemia, CMP within normal limits.  He was given 1 L of LR for postural tachycardia and likely dehydration.  Because he continued to have postural tachycardia he was given a second liter of fluids.  After the second liter he is no longer having tachycardia, pulse sitting in the 80s.  Pulse remained within normal range and did not have O2 saturation drops.  He was deemed safe for discharge and advised follow-up in cardiology clinic.  He was counseled on alcohol cessation.    Today the patient reports that he feels well.  He denies cardiac symptoms.  He denies chest pain, exertional chest pain, shortness of breath, dyspnea on exertion, PND, orthopnea, lower extremity edema, tachypalpitations, dizziness and syncope.  He denies claudication.  Dr. Renard Matter, his family physician had increased his dose of Effexor from 150 mg to 300 mg daily at his office visit on 01/20/2021.  Following that he started having increasing shortness of breath and couple of near syncopal episodes that prompted him to go to the emergency  department on 02/17/2021 as described above.  On 2/7 he decreased the Effexor dose back to 150 mg daily and since that time he tells me that he feels much better and is not having shortness of breath, dizziness or feeling like he might pass out.  Has had no recurrence of near syncope.  He tells me that his symptoms are completely resolved. 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  He tells me that he can do about any activity without becoming winded or short of air.  He denies getting short of air with strenuous activity.  Overall he feels like his symptoms have resolved since he reduced the dose of the Effexor back 250 mg daily.  He does not smoke but does chew tobacco regularly. He tells me he drinks 6-8 beers a night and states he has never had withdrawal symptoms or withdrawal seizures.  His blood pressure today is 138/84, pulse 84 bpm.    Cardiovascular Studies  12 ECG done today preliminary interpretation shows sinus rhythm at 84 bpm.  PR interval 174 ms, QRS 80 ms, QTc 404 ms.  Normal ECG.  In comparison to the prior ECG completed on 02/17/2021 at [facility], patient was in sinus tachycardia at 116 bpm.  On current EKG ventricular rate is controlled.    Assessment and Plan:  1.  Nonobstructive coronary artery disease.  Status post left heart catheterization in March 2018-patient found to have a 40 to 50% mid LAD stenosis.  Patient remains stable from a cardiac standpoint.  He is managed medically.  He had a recent episode of 2-week history of gross of dyspnea on exertion which she feels is related to his Effexor dose being increased.  He also had 2 near syncopal episodes recently.  He was evaluated in the emergency department at [facility] on 02/17/2021 as described above.  His cardiac work-up was negative.  He reduce the dose of his Effexor back to his previous dose of 150 mg daily and his symptoms improved and are now resolved.  He denies any symptoms of chest pain or angina.  He does not have any symptoms compatible with arrhythmias or congestive heart failure.Given his recent shortness of breath risk factors and known CAD I do think it is prudent to repeat a MPI stress test to make sure there has been no progression of coronary ischemia.   Will continue aspirin and statin.    2.  Hypertension.  Blood pressure is borderline today at 138/84.  Of asked him to check his blood pressure at home and keep a diary and call us if he is having elevated readings.    3.  Hyperlipidemia, treated controlled on atorvastatin 40 mg daily.  A lipid profile checked on 01/30/2021 shows a total cholesterol 173, triglycerides 76, HDL 73, LDL 69.  LDL at goal.    4. 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    5.  Alcoholism.  Reports drinking about6- 8 beers a night.  I have advised him to avoid alcohol completely but if he is not able to stop that he should reduce his intake.    6. Anxiety.     7.  Tobacco abuse.  Patient does not smoke but he does chew  tobacco.  I discussed with patient risks of continuing to chew and advised him to avoid all tobacco use.    -No medication changes.  Continue current medical therapy.  - Exercise MPI stress test.  - Asked patient to check his blood pressure and pulse at least 3 days a week and keep a diary.  - Instructed to call office if his blood pressures consistently above 135 systolic or above 85 diastolic.  -Risk factors reduction and lifestyle modifications.  - Advised alcohol cessation or  cut back to no more than 2 beers a day (each can 12 ounces).  - Advised to stop chewing tobacco.  - Advised to contact PCP if he has any withdrawal symptoms or withdrawal seizures.  - Cardiac healthy low-fat, low cholesterol, low triglyceride, low-sodium diet.  - Recommend regular exercise.  Encouraged start a regular walking program, target minimum of 30 minutes of moderate aerobic exercise 5 days a week.    Follow Up: Dr. Radford Pax in 6-8  months in our [place], [place] clinic.  Patient encouraged to contact our office if he has problems prior to next visit.    Patient was educated on the plan of care.  Patient verbally expressed understanding and agreement with the plan.  Instructions are outlined in the after visit summary document.     Thank you for the opportunity participate in the care of your patient.  If you have any questions or concerns please denies intent to contact me.     Total time spent with patient 50 minutes. Time spent with patient includes reviewing records including hospital records, physical exam, assessment , formation of treatment plan and documentation.  I reviewed and discussed his  heart disease,  medication instructions, medication interactions, treatment options/risk/benefits, blood pressure monitoring, blood pressure goals, diet/sodium restriction, risk factors reduction, exercise, weight reduction, follow-up plan.  I reviewed the EMR today including but not limited to provider notes, most recent laboratory, radiologic and cardiac test results at the time of the patient's visit.  All questions were answered to the patient's satisfaction.                                      DRB  Vitals:    03/07/21 1351   BP: 138/84   BP Source: Arm, Left Upper   Pulse: 94   SpO2: 95%   O2 Percent: 95 %   O2 Device: None (Room air)   PainSc: Zero   Weight: 75.7 kg (166 lb 12.8 oz)   Height: 172.7 cm (5' 8)     Body mass index is 25.36 kg/m?Marland Kitchen     Past Medical History  Patient Active Problem List    Diagnosis Date Noted   ? Coronary artery disease involving native coronary artery of native heart without angina pectoris 03/07/2021   ? Other forms of angina pectoris (HCC) 01/25/2020   ? Pure hypercholesterolemia 05/17/2018   ? Fatigue 10/04/2017   ? 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 Need for lipid screening 10/04/2017   ? Anxiety 09/06/2017   ? Nausea 03/12/2017   ? Anxiety about blushing 03/12/2017   ? Anxiety and depression 11/13/2016   ? 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 Elevated PSA 05/22/2016   ? Epigastric pain 05/21/2016     Added automatically from request for surgery [id number]     ? Chest pain due to GERD 05/21/2016     Added  automatically from request for surgery [id number]     ? Depression 04/15/2016 ? 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 Hyperlipidemia 04/10/2016   ? Prostate cancer screening 04/10/2016   ? 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 Screen for colon cancer 04/10/2016   ? Chest pain 03/26/2016     03/17/2016 MPI Treadmill No evidence of ischemia or infarction.  Normal EF. No wall motion  NSR with sinus tach, brief run of paroxysmal SVT, resolved on its own.  No PVC's or PAC's.  No ST elevations or depression.  Hypertensive with response to exercise.  No wall motion abnormalities.      ? Essential hypertension 03/26/2016     03/17/2016  Echo Normal LV size and function.  LVEF est 65-70%.  Normal left ventricular diastolic function.  Mild tricuspid insufficiency. Trace mitral regurgitation.      ? Weight loss 03/26/2016   ? Tobacco chew use 03/26/2016   ? SOB (shortness of breath) 03/26/2016   ? Papillary carcinoma of thyroid (HCC) 10/03/2009     T2NxMX total thyroidectomy and RAI [facility] in December of 2007  Following for stable left thyroid bed nodule           Review of Systems   Constitutional: Negative.   HENT: Positive for tinnitus.    Eyes: Negative.    Cardiovascular: Positive for dyspnea on exertion.   Respiratory: Positive for shortness of breath.    Endocrine: Negative.    Hematologic/Lymphatic: Negative.    Skin: Negative.    Musculoskeletal: Negative.    Gastrointestinal: Negative.    Genitourinary: Negative.    Neurological: Negative.    Psychiatric/Behavioral: Negative.    Allergic/Immunologic: Negative.        Physical Exam  Vital signs were reviewed.   General Appearance: appears well nourished, appears relaxed, in no acute distress,wearing a face mask  Skin: warm, moist, intact, no rash or lesions, no xanthomas  HEENT: unremarkable, pupils equal and round, no scleral icterus, conjunctivae and lids normal  Lips & Mouth: no pallor or cyanosis  Neck Veins: JVP normal, JVP is not elevated above the sternal notch,neck veins are flat, neck veins are not distended Carotid Arteries: normal carotid upstroke bilaterally, no bruits bilaterally  Chest Inspection: chest is normal in appearance  Auscultation/Percussion/Effort: lungs clear to auscultation, no rales, rhonchi, or wheezing, respirations even and unlabored, no respiratory distress  Cardiac Rhythm: regular rhythm and normal rate   Cardiac Auscultation: normal S1 & S2, no S3 or S4, no rub   Murmurs: no cardiac murmurs   Extremities: no lower extremity edema bilaterally, 2+ symmetric distal pulses   Abdominal Exam: soft, non-tender,non-distended, no obvious masses, bowel sounds normal, no guarding, no abdominal bruits  Liver & Spleen: no organomegaly   Neurologic Exam: grossly intact, alert, moves all extremities equally   Orientation: oriented to time, person and place, clear historian  Gait: normal, steady, walks without assistance  Language & Memory: speech clear, patient responsive, seems to comprehend information  Psych: appropriate mood and affect, thought content and behavior normal                  Cardiovascular Health Factors  Vitals BP Readings from Last 3 Encounters:   03/07/21 138/84   02/17/21 (S) (!) 146/87   01/20/21 135/78     Wt Readings from Last 3 Encounters:   03/07/21 75.7 kg (166 lb 12.8 oz)   02/17/21 74.8 kg (165 lb)   01/20/21 75.2 kg (165 lb 12.8  oz)     BMI Readings from Last 3 Encounters:   03/07/21 25.36 kg/m?   02/17/21 25.09 kg/m?   01/20/21 25.21 kg/m?      Smoking Social History     Tobacco Use   Smoking Status Former   ? Packs/day: 1.00   ? Years: 26.00   ? Pack years: 26.00   ? Types: Cigarettes   ? Quit date: 01/13/2008   ? Years since quitting: 13.1   Smokeless Tobacco Current   ? Types: Chew   Tobacco Comments    Counseling given 1.31.13      Lipid Profile Cholesterol   Date Value Ref Range Status   01/30/2021 173  Final     HDL   Date Value Ref Range Status   01/30/2021 73  Final     LDL   Date Value Ref Range Status   01/30/2021 69.20  Final     Triglycerides   Date Value Ref Range Status   01/30/2021 76  Final      Blood Sugar Hemoglobin A1C   Date Value Ref Range Status   07/24/2020 5.2 4.0 - 6.0 % Final     Comment:     The ADA recommends that most patients with type 1 and type 2 diabetes maintain   an A1c level <7%.       Glucose   Date Value Ref Range Status   02/17/2021 97 70 - 100 MG/DL Final   53/66/4403 474 (A) 74 - 100 Final   07/24/2020 127 (H) 70 - 100 MG/DL Final     Glucose, POC   Date Value Ref Range Status   03/21/2020 100 70 - 100 MG/DL Final          Problems Addressed Today  Encounter Diagnoses   Name Primary?   ? Coronary artery disease involving native coronary artery of native heart without angina pectoris Yes   ? Screening for heart disease    ? Essential hypertension    ? Hyperlipidemia, unspecified hyperlipidemia type    ? Anxiety    ? SOB (shortness of breath)                    Current Medications (including today's revisions)  ? atenolol (TENORMIN) 25 mg tablet Take one tablet by mouth daily.   ? atorvastatin (LIPITOR) 40 mg tablet Take one tablet by mouth daily.   ? chlorthalidone (HYGROTON) 25 mg tablet Take one tablet by mouth daily.   ? levothyroxine (SYNTHROID) 125 mcg tablet Take one tablet by mouth daily 30 minutes before breakfast. Do NOT take on Sundays   ? other medication Apply one Dose topically to affected area daily. Medication Name & Strength: Testosterone Gel 2%  Apply 4 clicks ( ) topically daily   ? sildenafiL (VIAGRA) 25 mg tablet Take one tablet by mouth as Needed for Erectile dysfunction. Max of 1 tablet per day.   ? venlafaxine XR (EFFEXOR XR) 150 mg capsule Take one capsule by mouth daily. Take with food.

## 2021-03-19 ENCOUNTER — Ambulatory Visit: Admit: 2021-03-19 | Discharge: 2021-03-19 | Payer: BC Managed Care – HMO

## 2021-03-19 ENCOUNTER — Encounter: Admit: 2021-03-19 | Discharge: 2021-03-19 | Payer: BC Managed Care – HMO

## 2021-03-19 DIAGNOSIS — I10 Essential (primary) hypertension: Secondary | ICD-10-CM

## 2021-03-19 DIAGNOSIS — R0602 Shortness of breath: Secondary | ICD-10-CM

## 2021-03-19 DIAGNOSIS — I2510 Atherosclerotic heart disease of native coronary artery without angina pectoris: Secondary | ICD-10-CM

## 2021-03-19 DIAGNOSIS — E785 Hyperlipidemia, unspecified: Secondary | ICD-10-CM

## 2021-03-19 MED ORDER — RP DX TC-99M TETROFOSMIN MCI
8 | Freq: Once | INTRAVENOUS | 0 refills | Status: CP
Start: 2021-03-19 — End: ?

## 2021-03-19 MED ORDER — RP DX TC-99M TETROFOSMIN MCI
24 | Freq: Once | INTRAVENOUS | 0 refills | Status: CP
Start: 2021-03-19 — End: ?

## 2021-03-21 ENCOUNTER — Encounter: Admit: 2021-03-21 | Discharge: 2021-03-21 | Payer: BC Managed Care – HMO

## 2021-03-21 NOTE — Telephone Encounter
[name], Jeffrey E, APRN-NP  P [facility] [place] [facility], please let patient know I reviewed the results of his Bruce protocol exercise MPI stress test that was obtained yesterday 03/19/21, study interpreted by Dr. Meridee Score. The study is normal--no ischemia and normal LV systolic function, EF 86%.      Study demonstrated good exercise capacity-exercised for 10 minutes and 49 seconds on a Bruce protocol and achieved peak heart rate of 176 bpm which is 109% of maximum heart rate predicted for age. Exercise ECG negative for ischemia. There is appropriate heart rate and blood pressure response to exercise. No stress-induced ectopy or dysrhythmias.     Result is good news and reassuring.    Results and recommendations called to patient lmom requested call back if questions

## 2021-08-08 ENCOUNTER — Encounter: Admit: 2021-08-08 | Discharge: 2021-08-08 | Payer: BC Managed Care – HMO

## 2021-09-17 ENCOUNTER — Encounter: Admit: 2021-09-17 | Discharge: 2021-09-17 | Payer: BC Managed Care – HMO

## 2021-09-17 MED ORDER — LEVOTHYROXINE 125 MCG PO TAB
125 ug | ORAL_TABLET | Freq: Every day | ORAL | 1 refills | 30.00000 days | Status: AC
Start: 2021-09-17 — End: ?

## 2021-10-01 ENCOUNTER — Encounter: Admit: 2021-10-01 | Discharge: 2021-10-01 | Payer: BC Managed Care – HMO

## 2021-10-01 MED ORDER — OTHER MEDICATION
1 | Freq: Every day | TOPICAL | 5 refills | Status: AC
Start: 2021-10-01 — End: ?

## 2021-10-01 NOTE — Telephone Encounter
Medication called into [facility] pharmacy.

## 2021-10-10 ENCOUNTER — Encounter: Admit: 2021-10-10 | Discharge: 2021-10-10 | Payer: BC Managed Care – HMO

## 2021-10-10 MED ORDER — VENLAFAXINE 150 MG PO CP24
150 mg | ORAL_CAPSULE | Freq: Every day | ORAL | 0 refills | Status: AC
Start: 2021-10-10 — End: ?

## 2021-10-10 NOTE — Telephone Encounter
Some refill protocol elements NOT Met  Medication name: Venlafaxine  Medication Strength: 133m    Last Fill Date: 02/19/2021    Manual message review      Valid encounter within last 6 months    BP completed in the last 6 months         Routed to Provider       KCarlos American RN

## 2021-10-21 ENCOUNTER — Encounter: Admit: 2021-10-21 | Discharge: 2021-10-21 | Payer: BC Managed Care – HMO

## 2021-11-11 ENCOUNTER — Encounter: Admit: 2021-11-11 | Discharge: 2021-11-11 | Payer: BC Managed Care – HMO

## 2021-11-11 DIAGNOSIS — C73 Malignant neoplasm of thyroid gland: Secondary | ICD-10-CM

## 2021-11-11 DIAGNOSIS — I2089 Other forms of angina pectoris: Secondary | ICD-10-CM

## 2021-11-11 MED ORDER — ATENOLOL 25 MG PO TAB
25 mg | ORAL_TABLET | Freq: Every day | ORAL | 1 refills | 33.00000 days | Status: AC
Start: 2021-11-11 — End: ?

## 2021-11-11 MED ORDER — ATORVASTATIN 40 MG PO TAB
40 mg | ORAL_TABLET | Freq: Every day | ORAL | 1 refills | Status: AC
Start: 2021-11-11 — End: ?

## 2021-11-11 MED ORDER — CHLORTHALIDONE 25 MG PO TAB
25 mg | ORAL_TABLET | Freq: Every day | ORAL | 1 refills | Status: AC
Start: 2021-11-11 — End: ?

## 2021-11-11 NOTE — Telephone Encounter
Some refill protocol elements NOT Met  Medication name: chlorthalidone (HYGROTON)   Medication Strength: 25 mg tablet    Last Fill Date: 01/20/2021    LOV: 01/20/2021  NOV: Visit date not found      Office visit due and Labs due    Routed to Provider        Some refill protocol elements NOT Met  Medication name: atenolol (TENORMIN)   Medication Strength: 25 mg tablet    Last Fill Date: 01/20/2021    LOV: 01/20/2021  NOV: Visit date not found    Labs due    Routed to Provider

## 2021-11-21 ENCOUNTER — Encounter: Admit: 2021-11-21 | Discharge: 2021-11-21 | Payer: BC Managed Care – HMO

## 2021-11-21 DIAGNOSIS — I2089 Other forms of angina pectoris: Secondary | ICD-10-CM

## 2021-11-21 DIAGNOSIS — C73 Malignant neoplasm of thyroid gland: Secondary | ICD-10-CM

## 2021-11-21 MED ORDER — ATORVASTATIN 40 MG PO TAB
40 mg | ORAL_TABLET | Freq: Every day | ORAL | 1 refills
Start: 2021-11-21 — End: ?

## 2021-11-21 MED ORDER — CHLORTHALIDONE 25 MG PO TAB
25 mg | ORAL_TABLET | Freq: Every day | ORAL | 1 refills
Start: 2021-11-21 — End: ?

## 2021-11-21 MED ORDER — ATENOLOL 25 MG PO TAB
25 mg | ORAL_TABLET | Freq: Every day | ORAL | 1 refills
Start: 2021-11-21 — End: ?

## 2021-12-18 ENCOUNTER — Encounter: Admit: 2021-12-18 | Discharge: 2021-12-18 | Payer: BC Managed Care – HMO

## 2021-12-18 MED ORDER — VENLAFAXINE 150 MG PO CP24
150 mg | ORAL_CAPSULE | Freq: Every day | ORAL | 0 refills | Status: CN
Start: 2021-12-18 — End: ?

## 2021-12-18 NOTE — Telephone Encounter
Some refill protocol elements NOT Met  Medication name: venlafaxine   Medication Strength: 150 mg     Last Fill Date: 10/10/21    Office visit due and Labs due    Routed to Provider  Daune Perch

## 2021-12-22 ENCOUNTER — Encounter: Admit: 2021-12-22 | Discharge: 2021-12-22 | Payer: BC Managed Care – HMO

## 2021-12-22 MED ORDER — VENLAFAXINE 150 MG PO CP24
ORAL_CAPSULE | 0 refills | Status: AC
Start: 2021-12-22 — End: ?

## 2021-12-22 NOTE — Telephone Encounter
Some refill protocol elements NOT Met  Medication name: venlafaxine XR (EFFEXOR XR)   Medication Strength: 150 mg capsule     Last Fill Date: 07/25/2021    LOV: Visit date not found     Office visit due and Labs due    Routed to Provider

## 2022-01-15 ENCOUNTER — Encounter: Admit: 2022-01-15 | Discharge: 2022-01-15 | Payer: BC Managed Care – HMO

## 2022-02-20 ENCOUNTER — Encounter: Admit: 2022-02-20 | Discharge: 2022-02-20 | Payer: BC Managed Care – HMO

## 2022-02-20 MED ORDER — VENLAFAXINE 150 MG PO CP24
ORAL_CAPSULE | 0 refills | Status: AC
Start: 2022-02-20 — End: ?

## 2022-02-20 NOTE — Telephone Encounter
Some refill protocol elements NOT Met  Medication name: effexor  Medication Strength: 150 mg    Last Fill Date: 12/22/21    Abnormal labs  Lab Results   Component Value Date    CHOL 173 01/30/2021    TRIG 76 01/30/2021    HDL 73 01/30/2021    LDL 69.20 01/30/2021    VLDL 15 01/30/2021    NONHDLCHOL 156 02/15/2018    [place] 2 01/30/2021          Routed to Provider

## 2022-03-03 ENCOUNTER — Encounter: Admit: 2022-03-03 | Discharge: 2022-03-03 | Payer: BC Managed Care – HMO

## 2022-03-03 DIAGNOSIS — R0602 Shortness of breath: Secondary | ICD-10-CM

## 2022-03-03 DIAGNOSIS — E7800 Pure hypercholesterolemia, unspecified: Secondary | ICD-10-CM

## 2022-03-03 DIAGNOSIS — Z136 Encounter for screening for cardiovascular disorders: Secondary | ICD-10-CM

## 2022-03-03 DIAGNOSIS — I10 Essential (primary) hypertension: Secondary | ICD-10-CM

## 2022-03-03 DIAGNOSIS — H9319 Tinnitus, unspecified ear: Secondary | ICD-10-CM

## 2022-03-03 DIAGNOSIS — E042 Nontoxic multinodular goiter: Secondary | ICD-10-CM

## 2022-03-03 DIAGNOSIS — F419 Anxiety disorder, unspecified: Secondary | ICD-10-CM

## 2022-03-03 DIAGNOSIS — C73 Malignant neoplasm of thyroid gland: Secondary | ICD-10-CM

## 2022-03-03 DIAGNOSIS — R079 Chest pain, unspecified: Secondary | ICD-10-CM

## 2022-03-03 DIAGNOSIS — F1020 Alcohol dependence, uncomplicated: Secondary | ICD-10-CM

## 2022-03-03 DIAGNOSIS — Z923 Personal history of irradiation: Secondary | ICD-10-CM

## 2022-03-03 DIAGNOSIS — E349 Endocrine disorder, unspecified: Secondary | ICD-10-CM

## 2022-03-03 DIAGNOSIS — I2089 Other forms of angina pectoris: Secondary | ICD-10-CM

## 2022-03-03 DIAGNOSIS — L918 Other hypertrophic disorders of the skin: Secondary | ICD-10-CM

## 2022-03-03 DIAGNOSIS — D499 Neoplasm of unspecified behavior of unspecified site: Secondary | ICD-10-CM

## 2022-03-03 NOTE — Patient Instructions
Lipids  Follow year  Derm referral

## 2022-03-03 NOTE — Progress Notes
Date of Service: 03/03/2022    Jeffrey Sanders is a 62 y.o. male.       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    Need for lipid screening 10/04/2017    Anxiety 09/06/2017    Nausea 03/12/2017    Anxiety about blushing 03/12/2017    Anxiety and depression 11/13/2016    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  Normal EF. No wall motion  NSR with sinus tach, brief run of paroxysmal SVT, resolved on its own.  No PVC's or PAC's.  No ST elevations or depression.  Hypertensive with response to exercise.  No wall motion abnormalities.       Essential hypertension 03/26/2016     03/17/2016  Echo Normal LV size and function.  LVEF est 65-70%.  Normal left ventricular diastolic function.  Mild tricuspid insufficiency. Trace mitral regurgitation.       Weight loss 03/26/2016    Tobacco chew use 03/26/2016    SOB (shortness of breath) 03/26/2016    Papillary carcinoma of thyroid (HCC) 10/03/2009     T2NxMX total thyroidectomy and RAI completed [facility] in December of 2007  Following for stable left thyroid bed nodule         Review of Systems   Constitutional: Negative.   HENT: Negative.     Eyes: Negative.    Cardiovascular: Negative.    Respiratory: Negative.     Endocrine: Negative.    Hematologic/Lymphatic: Negative.    Skin: Negative.    Musculoskeletal: Negative.    Gastrointestinal: Negative.    Genitourinary: Negative.    Neurological: Negative.    Psychiatric/Behavioral: Negative.     Allergic/Immunologic: Negative.        Vitals:    03/03/22 0824   BP: 118/76   BP Source: Arm, Left Upper   Pulse: 93   SpO2: 98%   O2 Device: None (Room air)   PainSc: Zero   Weight: 75.8 kg (167 lb)   Height: 172.7 cm (5' 8)     Body mass index is 25.39 kg/m?Marland Kitchen    Physical Examination:  General Appearance: No acute distress. Fully alert and oriented.  Skin: Warm. No ulcers or xanthomas.   HEENT: Grossly unremarkable. Lips and oral mucosa without pallor or cyanosis. Moist mucous membranes.   Neck Veins: Normal jugular venous pressure. Neck veins are not distended.  Carotid Arteries: Normal carotid upstroke bilaterally. No  bruits.  Chest Inspection: Chest is normal in appearance.  Auscultation/Percussion: Normal respiratory effort. Lungs clear to auscultation bilaterally. No wheezes, rales, or rhonchi.    Cardiac Rhythm: Regular rhythm. Normal rate.  Cardiac Auscultation: Normal S1 & S2. No S3 or S4. No rub.  Murmurs: No cardiac murmurs.  Peripheral Circulation: Normal peripheral circulation.   Abdominal Aorta: No abdominal aortic bruit.  Extremities: Appropriately warm to touch. No lower extremity edema.  Abdominal Exam: Soft, non-tender. No masses, no organomegaly. Normal bowel sounds.  Neurologic Exam: Neurological assessment grossly intact.       Assessment and Plan:   Dictation on: 03/03/2022  8:53 AM by: Allen Kell [[email or web address]]            Total time spent on today's office visit was 35 minutes. This includes face-to-face in person visit with patient as well as non face-to-face time including review of the electronic medical record, outside records, labs, radiologic studies, cardiovascular studies, formulation of treatment plan, after visit summary, future disposition, personal discussions, and documentation.    Current Medications (including today's revisions)   atenolol (TENORMIN) 25 mg tablet Take one tablet by mouth daily.    atorvastatin (LIPITOR) 40 mg tablet Take one tablet by mouth daily.    chlorthalidone (HYGROTON) 25 mg tablet Take one tablet by mouth daily.    levothyroxine (SYNTHROID) 125 mcg tablet Take one tablet by mouth daily 30 minutes before breakfast. Do NOT take on Sundays    other medication Apply one Dose topically to affected area daily. Medication Name & Strength: Testosterone Gel 2%  Apply 4 clicks ( ) topically daily    sildenafiL (VIAGRA) 25 mg tablet Take one tablet by mouth as Needed for Erectile dysfunction. Max of 1 tablet per day.    venlafaxine XR (EFFEXOR XR) 150 mg capsule TAKE ONE CAPSULE BY MOUTH EVERY DAY WITH FOOD

## 2022-03-13 ENCOUNTER — Encounter: Admit: 2022-03-13 | Discharge: 2022-03-13 | Payer: BC Managed Care – HMO

## 2022-03-13 MED ORDER — LEVOTHYROXINE 125 MCG PO TAB
ORAL_TABLET | 1 refills
Start: 2022-03-13 — End: ?

## 2022-03-23 ENCOUNTER — Encounter: Admit: 2022-03-23 | Discharge: 2022-03-23 | Payer: BC Managed Care – HMO

## 2022-03-23 DIAGNOSIS — I2089 Other forms of angina pectoris: Secondary | ICD-10-CM

## 2022-03-23 DIAGNOSIS — E7800 Pure hypercholesterolemia, unspecified: Secondary | ICD-10-CM

## 2022-03-23 DIAGNOSIS — I10 Essential (primary) hypertension: Secondary | ICD-10-CM

## 2022-03-23 DIAGNOSIS — R0602 Shortness of breath: Secondary | ICD-10-CM

## 2022-03-23 DIAGNOSIS — Z136 Encounter for screening for cardiovascular disorders: Secondary | ICD-10-CM

## 2022-04-06 ENCOUNTER — Encounter: Admit: 2022-04-06 | Discharge: 2022-04-06 | Payer: BC Managed Care – HMO

## 2022-04-06 DIAGNOSIS — E7800 Pure hypercholesterolemia, unspecified: Secondary | ICD-10-CM

## 2022-04-06 DIAGNOSIS — I2510 Atherosclerotic heart disease of native coronary artery without angina pectoris: Secondary | ICD-10-CM

## 2022-04-06 MED ORDER — ATORVASTATIN 80 MG PO TAB
80 mg | ORAL_TABLET | Freq: Every day | ORAL | 3 refills | Status: AC
Start: 2022-04-06 — End: ?

## 2022-04-06 NOTE — Telephone Encounter
-----   Message from Benna Dunks, MD sent at 03/30/2022  4:05 PM CDT -----  I think we should increase the atorvastatin from 40 to 80 mg nightly.  Repeat fasting lipid panel in 3 months.  Thank you!  ----- Message -----  From: Baldwin Crown, RN  Sent: 03/23/2022   2:08 PM CDT  To: Benna Dunks, MD    Labs for your review, LDL 71, pt is on atorvastatin 40mg  daily.  Please let me know if you have any recommendations.  Thank you!

## 2022-04-06 NOTE — Telephone Encounter
Left message requesting callback to discuss.  Sent mychart message with recommendations.  Left callback number for any questions or concerns.

## 2022-04-14 ENCOUNTER — Encounter: Admit: 2022-04-14 | Discharge: 2022-04-14 | Payer: BC Managed Care – HMO

## 2022-04-14 MED ORDER — LEVOTHYROXINE 125 MCG PO TAB
ORAL_TABLET | 0 refills
Start: 2022-04-14 — End: ?

## 2022-05-13 ENCOUNTER — Encounter: Admit: 2022-05-13 | Discharge: 2022-05-13 | Payer: BC Managed Care – HMO

## 2022-05-13 MED ORDER — LEVOTHYROXINE 125 MCG PO TAB
125 ug | ORAL_TABLET | Freq: Every day | ORAL | 0 refills | 30.00000 days | Status: AC
Start: 2022-05-13 — End: ?

## 2022-05-13 MED ORDER — VENLAFAXINE 150 MG PO CP24
150 mg | ORAL_CAPSULE | Freq: Every day | ORAL | 0 refills | Status: AC
Start: 2022-05-13 — End: ?

## 2022-05-13 NOTE — Telephone Encounter
05/13/2022 1:49 PM   Certified letter sent. Tracking number [id number]

## 2022-05-13 NOTE — Telephone Encounter
LVM for patient that he must have an appointment for further refills and gave the scheduling number.     Some refill protocol elements NOT Met  Medication name: Venlafaxine and Levothyroxine  Medication Strength: 150mg  and    Last Fill Date: 02/20/22 and 03/16/22    Office visit due    Routed to Provider

## 2022-05-14 ENCOUNTER — Encounter: Admit: 2022-05-14 | Discharge: 2022-05-14 | Payer: BC Managed Care – HMO

## 2022-05-14 MED ORDER — ATENOLOL 25 MG PO TAB
25 mg | ORAL_TABLET | Freq: Every day | ORAL | 1 refills
Start: 2022-05-14 — End: ?

## 2022-05-14 NOTE — Telephone Encounter
Some refill protocol elements NOT Met  Medication name: Atenolol (Tenormin)  Medication Strength: 25 mg tablet    Last Fill Date: 11/26/2021    Office visit due    Cardiovascular: Beta Blockers 3 Failed05/02/2022 03:15 PM   Protocol Details   Valid encounter within last 12 months    Cr in normal range and within 180 days    eGFR in normal range and within 180 days    BP completed in the last 12 months    Heart Rate completed in the last 12 months        Refill denied.  Upon chart review patient has received VM and certified letter explaining no further refill until seen in clinic.     Joline Maxcy, RN

## 2022-05-20 ENCOUNTER — Encounter: Admit: 2022-05-20 | Discharge: 2022-05-20 | Payer: BC Managed Care – HMO

## 2022-05-29 ENCOUNTER — Encounter: Admit: 2022-05-29 | Discharge: 2022-05-29 | Payer: BC Managed Care – HMO

## 2022-05-29 MED ORDER — ATENOLOL 25 MG PO TAB
25 mg | ORAL_TABLET | Freq: Every day | ORAL | 0 refills | 33.00000 days | Status: AC
Start: 2022-05-29 — End: ?

## 2022-05-29 NOTE — Telephone Encounter
Patient called stating he made an appointment for June. Requesting refill of his atenolol 30 day RX sent per protocol.

## 2022-06-01 ENCOUNTER — Encounter: Admit: 2022-06-01 | Discharge: 2022-06-01 | Payer: BC Managed Care – HMO

## 2022-06-01 DIAGNOSIS — E291 Testicular hypofunction: Secondary | ICD-10-CM

## 2022-06-01 MED ORDER — TESTOSTERONE 50 MG/5 GRAM (1 %) TD GEL
5 g | Freq: Every day | TRANSDERMAL | 0 refills | 28.00000 days | Status: DC
Start: 2022-06-01 — End: 2022-06-01

## 2022-06-01 MED ORDER — OTHER MEDICATION
1 | Freq: Every day | TOPICAL | 0 refills | Status: AC
Start: 2022-06-01 — End: ?

## 2022-06-01 MED ORDER — OTHER MEDICATION
1 | Freq: Every day | TOPICAL | 0 refills | Status: CN
Start: 2022-06-01 — End: ?

## 2022-06-01 NOTE — Telephone Encounter
Request for testosterone refill received. Patient has an appointment next month. Must route to physician for approval.     Some refill protocol elements NOT Met  Medication name: testosterone  Medication Strength: 2%    Last Fill Date: 10/01/21    Non-delegated medication    Routed to Provider

## 2022-06-01 NOTE — Telephone Encounter
Called prescription in to [facility].

## 2022-06-01 NOTE — Telephone Encounter
Call from [facility] that incorrect RX was sent over by Dr. Renard Matter. The 1% is not covered by insurance and the patient gets the 2% which is compounded. Routing to Dr. Renard Matter to sign RX so that I can call in the prescription like usual.

## 2022-06-11 ENCOUNTER — Encounter: Admit: 2022-06-11 | Discharge: 2022-06-11 | Payer: BC Managed Care – HMO

## 2022-06-11 MED ORDER — ATENOLOL 25 MG PO TAB
25 mg | ORAL_TABLET | Freq: Every day | ORAL | 1 refills
Start: 2022-06-11 — End: ?

## 2022-06-11 MED ORDER — CHLORTHALIDONE 25 MG PO TAB
25 mg | ORAL_TABLET | Freq: Every day | ORAL | 1 refills
Start: 2022-06-11 — End: ?

## 2022-06-18 ENCOUNTER — Encounter: Admit: 2022-06-18 | Discharge: 2022-06-18 | Payer: BC Managed Care – HMO

## 2022-06-25 ENCOUNTER — Encounter: Admit: 2022-06-25 | Discharge: 2022-06-25 | Payer: BC Managed Care – HMO

## 2022-08-05 ENCOUNTER — Encounter: Admit: 2022-08-05 | Discharge: 2022-08-05 | Payer: BC Managed Care – HMO

## 2022-08-05 MED ORDER — LEVOTHYROXINE 125 MCG PO TAB
125 ug | ORAL_TABLET | Freq: Every day | ORAL | 0 refills
Start: 2022-08-05 — End: ?

## 2022-08-05 MED ORDER — VENLAFAXINE 150 MG PO CP24
ORAL_CAPSULE | 0 refills
Start: 2022-08-05 — End: ?

## 2022-08-14 ENCOUNTER — Encounter: Admit: 2022-08-14 | Discharge: 2022-08-14 | Payer: BC Managed Care – HMO

## 2022-08-19 ENCOUNTER — Encounter: Admit: 2022-08-19 | Discharge: 2022-08-19 | Payer: BC Managed Care – HMO

## 2022-08-21 ENCOUNTER — Encounter: Admit: 2022-08-21 | Discharge: 2022-08-21 | Payer: BC Managed Care – HMO

## 2022-08-21 MED ORDER — ATENOLOL 25 MG PO TAB
25 mg | ORAL_TABLET | Freq: Every day | ORAL | 0 refills | 33.00000 days | Status: AC
Start: 2022-08-21 — End: ?

## 2022-08-21 NOTE — Telephone Encounter
Patient left a voice mail He needs his Atenolol renewed has an apointment for sept 16th at 1pm.    Raelene Bott, RN  CPK

## 2022-08-21 NOTE — Telephone Encounter
Some refill protocol elements NOT met    Medication Name: atenolol  Medication Strength: 25 mg tab  Medication Frequency: one tablet daily     Last Fill Date: 06/12/2022    Requested Prescriptions   Pending Prescriptions Disp Refills    atenolol (TENORMIN) 25 mg tablet 30 tablet 1     Sig: Take one tablet by mouth daily.       Cardiovascular: Beta Blockers 3 Failed - 08/21/2022  1:14 PM        Failed - Valid encounter within last 12 months     Recent Visits  Date Type Provider Dept   01/20/21 Office Visit Verneda Skill, MD [facility]   Showing recent visits within past 720 days and meeting all other requirements  Future Appointments  Date Type Provider Dept   09/28/22 Appointment Verneda Skill, MD [facility]   Showing future appointments within next 360 days and meeting all other requirements            Failed - Cr in normal range and within 180 days     Creatinine   Date Value Ref Range Status   02/17/2021 0.95 0.4 - 1.24 MG/DL Final   47/82/9562 1.30  Final              Failed - eGFR in normal range and within 180 days     eGFR Non African American   Date Value Ref Range Status   01/30/2021 105.2  Final   02/15/2018 >60 >60 mL/min Final     Comment:     The eGFR is not validated for use in drug dosing adjustments.  Continue to use   estimated creatinine clearance per dosing reference text.  Please contact the   Clinical Pharmacist for questions.       eGFR African American   Date Value Ref Range Status   02/15/2018 >60 >60 mL/min Final     Comment:     The eGFR is not validated for use in drug dosing adjustments.  Continue to use   estimated creatinine clearance per dosing reference text.  Please contact the   Clinical Pharmacist for questions.     11/13/2016 >60 >60 mL/min Final     Comment:     The eGFR is not validated for use in drug dosing adjustments.  Continue to use   estimated creatinine clearance per dosing reference text.  Please contact the   Clinical Pharmacist for questions.       eGFR Date Value Ref Range Status   02/17/2021 >60 >60 mL/min Final     Comment:     eGFR calculated using the CKD-EPIcr_R equation   07/24/2020 >60 >60 mL/min Final     Comment:     eGFR calculated using the CKD-EPIcr_R equation              Passed - BP completed in the last 12 months     BP Readings from Last 2 Encounters:   03/03/22 118/76   03/07/21 138/84             Passed - Heart Rate completed in the last 12 months     Pulse Readings from Last 2 Encounters:   03/03/22 93   03/07/21 94                Office visit due and Labs due    Routed to Provider    Raelene Bott, RN  CPK

## 2022-08-27 ENCOUNTER — Encounter: Admit: 2022-08-27 | Discharge: 2022-08-27 | Payer: BC Managed Care – HMO

## 2022-08-28 ENCOUNTER — Encounter: Admit: 2022-08-28 | Discharge: 2022-08-28 | Payer: BC Managed Care – HMO

## 2022-08-28 MED ORDER — ATENOLOL 25 MG PO TAB
25 mg | ORAL_TABLET | Freq: Every day | ORAL | 0 refills | 33.00000 days | Status: AC
Start: 2022-08-28 — End: ?

## 2022-08-28 MED ORDER — ATENOLOL 25 MG PO TAB
25 mg | ORAL_TABLET | Freq: Every day | ORAL | 1 refills
Start: 2022-08-28 — End: ?

## 2022-08-28 NOTE — Telephone Encounter
1. Received e-request from pharmacy requesting new Rx for atenolol.    2. Last Rx written:      Disp Refills Start End     atenolol (TENORMIN) 25 mg tablet 30 tablet 0 08/21/2022 --    Sig - Route: Take one tablet by mouth daily. - Oral    Sent to pharmacy as: atenoloL 25 mg tablet (TENORMIN)    E-Prescribing Status: Receipt confirmed by pharmacy (08/21/2022  1:20 PM CDT)        3. Last Gen Med Visit:    01/20/21    4. Next Appt due:    09/28/22    Future Appointments   Date Time Provider Department Center   09/28/2022  1:00 PM Verneda Skill, MD [facility]       5. All protocol criteria met? No    Office visit due, Labs due, and Abnormal labs    Routed to Provider

## 2022-09-13 ENCOUNTER — Encounter: Admit: 2022-09-13 | Discharge: 2022-09-13 | Payer: BC Managed Care – HMO

## 2022-09-13 MED ORDER — ATENOLOL 25 MG PO TAB
25 mg | ORAL_TABLET | Freq: Every day | ORAL | 0 refills
Start: 2022-09-13 — End: ?

## 2022-09-16 ENCOUNTER — Encounter: Admit: 2022-09-16 | Discharge: 2022-09-16 | Payer: BC Managed Care – HMO

## 2022-09-28 ENCOUNTER — Encounter: Admit: 2022-09-28 | Discharge: 2022-09-28 | Payer: BC Managed Care – HMO

## 2022-09-28 ENCOUNTER — Ambulatory Visit: Admit: 2022-09-28 | Discharge: 2022-09-29 | Payer: BC Managed Care – HMO

## 2022-09-28 DIAGNOSIS — F419 Anxiety disorder, unspecified: Secondary | ICD-10-CM

## 2022-09-28 DIAGNOSIS — C73 Malignant neoplasm of thyroid gland: Secondary | ICD-10-CM

## 2022-09-28 DIAGNOSIS — Z0000 Encounter for general adult medical examination without abnormal findings: Secondary | ICD-10-CM

## 2022-09-28 DIAGNOSIS — E042 Nontoxic multinodular goiter: Secondary | ICD-10-CM

## 2022-09-28 DIAGNOSIS — F1020 Alcohol dependence, uncomplicated: Secondary | ICD-10-CM

## 2022-09-28 DIAGNOSIS — R079 Chest pain, unspecified: Secondary | ICD-10-CM

## 2022-09-28 DIAGNOSIS — Z125 Encounter for screening for malignant neoplasm of prostate: Secondary | ICD-10-CM

## 2022-09-28 DIAGNOSIS — E890 Postprocedural hypothyroidism: Secondary | ICD-10-CM

## 2022-09-28 DIAGNOSIS — I10 Essential (primary) hypertension: Secondary | ICD-10-CM

## 2022-09-28 DIAGNOSIS — Z923 Personal history of irradiation: Secondary | ICD-10-CM

## 2022-09-28 DIAGNOSIS — E349 Endocrine disorder, unspecified: Secondary | ICD-10-CM

## 2022-09-28 DIAGNOSIS — D499 Neoplasm of unspecified behavior of unspecified site: Secondary | ICD-10-CM

## 2022-09-28 DIAGNOSIS — H9319 Tinnitus, unspecified ear: Secondary | ICD-10-CM

## 2022-09-28 DIAGNOSIS — E785 Hyperlipidemia, unspecified: Secondary | ICD-10-CM

## 2022-09-28 NOTE — Progress Notes
Date of Service: 09/28/2022    Subjective:             Jeffrey Sanders is a 62 y.o. male.  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 Royce Macadamia and HTN, hyperlipidemia presented for a annual physical.  He was last seen by me in Jan, 2023.  He denied having any He reports consuming alcohol every evening.  On average he drinks about 8-10 beers per night.He quit cold [place] in 2013 for 3 years and for the past 9 years again he has been drinking heavily.  He is not interested in going to the alcohol Anonymous or alcohol abuse program.  He has been taking venlafaxine and refill has been sent recently. He has been taking atenolol and chlorthalidone for hypertension and atorvastatin for hyperlipidemia.  His last blood work was in February 2023.  He is currently not fasting.  Lab orders have been provided to him.  He has not seen the endocrinologist is not covered by his insurance.  We talked about TSH and referral to endocrinology has been placed.  He does have a lesion underneath his right eye and has a dermatology appointment set up.  He does have his ophthalmology appointment in December.  History of Cowden's Dz (hamartoma syndrome), FAP - Familial Adenomatous Polyposis, or MEN syndrome: No  History of neck radiation exposure: No   Endocrinologist: Janyth Contes  Was also seen by pulmonary and had a CT scan for pulmonary nodules in the past.  He tells me that he is using testosterone gel however of the prescription has not been given by [place] providers so we talked about giving his name of the prescriber.  He has been evaluated by cardiology and recommendations have been reinforced upon him.  Follow-up appointments have been reinforced.denied having any other acute concerns at this point of time.        Past Medical History:   Diagnosis Date    Alcoholism (HCC)     Anxiety     Chest pain 03/26/2016    High blood pressure Hypertension 03/26/2016    Multiple thyroid nodules 12/27/2008    Neoplasm of unspecified nature, site unspecified     Papillary carcinoma of thyroid (HCC) 12/07    Ringing in the ears 12/27/2008    S/P radioactive iodine thyroid ablation     Unspecified endocrine disorder      Surgical History:   Procedure Laterality Date    ANGIOGRAPHY CORONARY ARTERY WITH LEFT HEART CATHETERIZATION WITH VENTRICULOGRAPHY Left 03/30/2016    Performed by Laney Pastor, MD at [facility] CATH LAB    POSSIBLE PERCUTANEOUS CORONARY STENT PLACEMENT WITH ANGIOPLASTY N/A 03/30/2016    Performed by Laney Pastor, MD at [facility] CATH LAB    COLONOSCOPY N/A 05/14/2016    Performed by Eliott Nine, MD at [facility] ENDO    COLONOSCOPY EXCISION LESION  05/14/2016    Performed by Eliott Nine, MD at [facility] ENDO    ESOPHAGOGASTRODUODENOSCOPY N/A 06/04/2016    Performed by Tempie Hoist, DO at [facility] ENDO    ESOPHAGOGASTRODUODENOSCOPY BIOPSY  06/04/2016    Performed by Tempie Hoist, DO at [facility] ENDO    HX THYROIDECTOMY      THYROIDECTOMY       Social History    Marital status: Single     Spouse name: N/A    Number of children:  N/A    Years of education: N/A     Occupational History     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  He does have a history of smoking of 3 packs a day, he quit a few years ago.  He currently chews tobacco. Has a history of significant alcohol use, he used to drink 14 -15 beers a night.  Patient tells me that currently he drinks 8-10 beers every night.       Social History Main Topics    Smoking status: Former Smoker     Packs/day: 1.00     Years: 26.00     Types: Cigarettes     Quit date: 01/13/2008    Smokeless tobacco: Current User     Types: Chew      Comment: Counseling given 1.31.13    Alcohol use No    Drug use: No    Sexual activity: Not on file          Review of Systems   Constitutional:  Negative for activity change, appetite change, fatigue, fever and unexpected weight change.        Flushed face appearance.   HENT:  Negative for dental problem, sore throat, tinnitus, trouble swallowing and voice change.    Eyes:  Negative for visual disturbance.   Respiratory:  Negative for shortness of breath.    Cardiovascular:  Negative for chest pain, palpitations and leg swelling.   Gastrointestinal:  Negative for abdominal pain, constipation, diarrhea and nausea.   Genitourinary:  Negative for difficulty urinating and frequency.   Musculoskeletal:  Negative for arthralgias and joint swelling.   Neurological:  Negative for dizziness, seizures and headaches.        Does report to me about possible alcohol withdrawal when he does not drink for a day.  He was constantly moving his feet during the appointment.   Hematological:  Negative for adenopathy.   Psychiatric/Behavioral:  Negative for behavioral problems, decreased concentration, dysphoric mood and sleep disturbance. The patient is not nervous/anxious.      Objective:          atenolol (TENORMIN) 25 mg tablet TAKE ONE TABLET BY MOUTH EVERY DAY    atorvastatin (LIPITOR) 80 mg tablet Take one tablet by mouth daily.    chlorthalidone (HYGROTON) 25 mg tablet TAKE ONE TABLET BY MOUTH EVERY DAY    levothyroxine (SYNTHROID) 125 mcg tablet TAKE ONE TABLET BY MOUTH EVERY DAY 30 minutes BEFORE BREAKFAST    other medication Apply one Dose topically to affected area daily. Medication Name & Strength: Testosterone Gel 2%  Apply 4 clicks ( ) topically daily    sildenafiL (VIAGRA) 25 mg tablet Take one tablet by mouth as Needed for Erectile dysfunction. Max of 1 tablet per day.    venlafaxine XR (EFFEXOR XR) 150 mg capsule TAKE ONE CAPSULE BY MOUTH EVERY DAY WITH FOOD     Vitals:    09/28/22 1304   BP: 123/73   Pulse: 80   Resp: 16   PainSc: Zero   Weight: 75.5 kg (166 lb 6.4 oz)   Height: 172.7 cm (5' 8)     Body mass index is 25.3 kg/m?Marland Kitchen     Physical Exam  General Appearance: normal in appearance  Skin: warm, moist,   Eyes: conjunctivae mildly red and  pupils are equal and round  Respiratory Effort: breathing comfortably, no respiratory distress  Cardiac Rhythm: regular rhythm and normal rate  Lower Extremity Edema: no lower extremity edema  Abdominal Exam: soft, non-tender, no masses, bowel sounds normal  Neurologic Exam: neurological assessment grossly intact   Psych: Mood appropriate.  Assessment and Plan:  Judea is a 62 year old gentleman who presented for annual physical.    Age-appropriate counseling has been provided and fasting blood work has been ordered.    Anxiety; counseling has been provided and continue Effexor XR 150 mg every day.Lexapro and buspar has been tried in the past.     History of T2, NX, MX papillary thyroid cancer ,left-followed closely by Endocrinology.  [place] FNA 06/2009 - No lesional tissue identified, Thyroglobulin AB <20. Non detectable last year Tgb recently 02/04/10. [place] L with stable2+cm remnant visible. FollOWs with Zhou, [place] T and Tgb panel ,TSH lower normal recently.On 125 mcg on synthroid.  TSH is being ordered as he has not been following with his other endocrinologist.    Hypertension Management: Continue on chlorthalidone and atenolol He is followed by local cardiologist.  BP Readings from Last 3 Encounters:   09/28/22 123/73   03/03/22 118/76   03/07/21 138/84     Male hypogonadism;  Testosterone is not on the list of medications, encourage the patient to give [place] more information.     Hyperlipidemia Management -continue on atorvastatin.  Lab Results   Component Value Date/Time    CHOL 168 03/20/2022 12:00 AM    TRIG 78 03/20/2022 12:00 AM    HDL 83 (H) 03/20/2022 12:00 AM    LDL 71 03/20/2022 12:00 AM    VLDL 16 03/20/2022 12:00 AM    NONHDLCHOL 156 02/15/2018 10:59 AM    CHOLHDLC 2 03/20/2022 12:00 AM    ALT 55 02/17/2021 03:31 PM    CK 41 03/19/2016 12:00 AM    VITD25 >99.0 (H) 07/24/2020 02:25 PM        Imp: Hyperlipidemia fair     Plan:  Discussed labs and reviewed goals for LDL, HDL, triglycerides.   Discussed exercise management and diet with emphasis on vegetables, fruit and lean meat.  Are barriers to achieving goals present? No  Medication education provided. Patient voiced understanding? Yes  Patient able to self-manage and ready to comply?Yes  Educational resources identified? Verbal Counseling     Pulmonary nodules  CT scan pending for follow up .  PET scan and a CT scan of the lung has been reviewed.    PPSV 23 given on 04/15/16, will need PSV when age >74     Insomnia; he had self discontinued trazodone in the past and has not been taking it.  Sleep hygiene has been discussed.  Alcohol cessation has been reinforced     Screening for prostate cancer - PSA is pending  HIV screening is negative  Hep C is negative    Immunization; Tdap iup to date        Patient Instructions   Health Screening Guidelines, Men Ages 46 to 32  Screening tests and health counseling are a key part of managing your health. A screening test is done to find disorders or diseases in people who don't have any symptoms. Screening tests are not used to diagnose. They are used to find out if more testing is needed. The goal may be to find a disease early so it can be treated with more success. Or the goal may be to find a disease early so you can make lifestyle changes. You may need regular checkups to help you reduce your risk of disease.  Below are guidelines for men ages 50 to  64. Talk with your healthcare provider. Make sure you?re up-to-date on what you need.             Screening Who needs it How often   Unhealthy alcohol use All men in this age group At routine exams   Blood pressure All men in this age group Once a year if your blood pressure is normal. Normal blood pressure is less than 120/80 mm Hg. If your blood pressure is higher than this, follow the advice of your healthcare provider.   Colorectal cancer All men in this age group Talk with your healthcare provider about which test below is right for you:  Colonoscopy every 10 years  Flexible sigmoidoscopy every 5 years or every 10 years with yearly fecal immunochemical test (FIT) stool test  CT colonography (virtual colonoscopy) every 5 years  Yearly fecal occult blood test  Yearly FIT  Stool FIT-DNA test (also called the stool DNA test) every 3 years  If you have a test that is not a colonoscopy and have an abnormal test result, you will need a colonoscopy.  You may need to be screened more or less often. This is based on personal or family health history. Talk with your healthcare provider.   Depression All men in this age group At routine exams   Type 2 diabetes or prediabetes All men in this age group with no symptoms who are overweight or obese. At least every 3 years (yearly if your blood sugar has already begun to rise)   Type 2 diabetes All men with prediabetes Every year   Hepatitis C All adults age 56 or older at least once in a lifetime. Talk with your healthcare provider about your risk factors and how often to have hepatitis C screening.   High cholesterol or triglycerides All men in this age group About every 1 to 2 years. Expert groups vary in their advice. Talk with your healthcare provider about your risk factors and how often you should be tested.   HIV All men in this age group At least 1 time. Talk with your healthcare provider about your risk factors. Ask if you should be tested more often.   Lung cancer All men in this age group who are in fairly good health and are at higher risk for lung cancer, and who:  Smoke or quit in the past 15 years  Have a 20-pack per year smoking history (1 pack a day for 20 years or 2 packs a day for 10 years)  Expert groups vary in their advice. Talk with your healthcare provider. Yearly lung cancer screening with a low-dose CT scan. Talk with your healthcare provider about your risk factors.   Obesity All men in this age group At yearly routine exams   BMI (body mass index) All men in this age group Every year, to help find out if you are at a healthy weight for your height.   Prostate cancer All men in this age group, talk with your healthcare provider about risks and benefits of a digital rectal exam and prostate-specific antigen screening At routine exams if you decide to be tested.   Syphilis Men at higher risk for infection At routine exams. Talk with your healthcare provider.   Tuberculosis Men at higher risk for infection Talk with your healthcare provider   Vision All men in this age group Baseline screening at age 11. Talk with your healthcare provider about how often to have vision exams.   Health  counseling Who needs it How often    Diet and exercise Men who are overweight or obese When diagnosed, and then at routine exams    Sexually transmitted infection prevention Men at higher risk for infection At routine exams, talk with your healthcare provider    Use of tobacco and the health effects it can cause All men in this age group Every exam      Routine Clinic Information:     Please don't hesitate to call if you have any problems or questions. Sam can be reached at [phone].     You may also message [place] in MyChart.     For refills on medications, please have your pharmacy fax a refill authorization request form to our office at Fax) [phone].     Please allow at least 3 business days for refill requests.     For urgent issues after business hours/weekends/holidays call [phone] and request for the outpatient internal medicine physician to be paged .    We offer same day appointments for your acute health concerns. These appointments are on a first come, first serve basis.     Please call [phone] if you would like to make an appointment. If I am not available, you can see any of my partners or try to see me the next day (call at 8am).     See you in  12 months     Orders Placed This Encounter    LIPID PROFILE     Standing Status:   Future     Standing Expiration Date:   09/29/2023     Order Specific Question:   Release to patient     Answer:   Immediate    TSH WITH FREE T4 REFLEX     Standing Status:   Future     Standing Expiration Date:   09/29/2023     Order Specific Question:   Release to patient     Answer:   Immediate    COMPREHENSIVE METABOLIC PANEL     Standing Status:   Future     Standing Expiration Date:   09/29/2023     Order Specific Question:   Release to patient     Answer:   Immediate    CBC AND DIFF     Standing Status:   Future     Standing Expiration Date:   09/29/2023     Order Specific Question:   Release to patient     Answer:   Immediate    PSA SCREEN     Standing Status:   Future     Standing Expiration Date:   09/29/2023     Order Specific Question:   Release to patient     Answer:   Immediate           Take care,    Dr.Matasha Smigelski & Sam       *Dictated with Dragon software, please forgive typos    Total of 60 minutes were spent on the same day of the visit including preparing to see the patient, obtaining and/or reviewing separately obtained history, performing a medically appropriate examination and/or evaluation, counseling and educating the patient/family/caregiver, ordering medications, tests, or procedures, referring and communication with other health care professionals, documenting clinical information in the electronic or other health record, independently interpreting results and communicating results to the patient/family/caregiver, and care coordination.

## 2022-09-30 ENCOUNTER — Encounter: Admit: 2022-09-30 | Discharge: 2022-09-30 | Payer: BC Managed Care – HMO

## 2022-09-30 DIAGNOSIS — E291 Testicular hypofunction: Secondary | ICD-10-CM

## 2022-09-30 MED ORDER — OTHER MEDICATION
1 | Freq: Every day | TOPICAL | 0 refills
Start: 2022-09-30 — End: ?

## 2022-09-30 NOTE — Telephone Encounter
Some refill protocol elements NOT Met  Medication name: Testosterone Gel  Medication Strength: 2%    Last Fill Date: 06/01/22    Non-delegated medication    Routed to Provider    Once approved MA will call prescription into pharmacy.

## 2022-10-07 ENCOUNTER — Encounter: Admit: 2022-10-07 | Discharge: 2022-10-07 | Payer: BC Managed Care – HMO

## 2022-10-07 DIAGNOSIS — C73 Malignant neoplasm of thyroid gland: Secondary | ICD-10-CM

## 2022-10-07 DIAGNOSIS — Z125 Encounter for screening for malignant neoplasm of prostate: Secondary | ICD-10-CM

## 2022-10-07 DIAGNOSIS — I10 Essential (primary) hypertension: Secondary | ICD-10-CM

## 2022-10-07 DIAGNOSIS — Z0000 Encounter for general adult medical examination without abnormal findings: Secondary | ICD-10-CM

## 2022-10-07 MED ORDER — ATENOLOL 25 MG PO TAB
25 mg | ORAL_TABLET | Freq: Every day | ORAL | 3 refills | 33.00000 days | Status: AC
Start: 2022-10-07 — End: ?

## 2022-10-09 ENCOUNTER — Encounter: Admit: 2022-10-09 | Discharge: 2022-10-09 | Payer: BC Managed Care – HMO

## 2022-10-09 NOTE — Telephone Encounter
Called patient and LVM (per communication authorization) with message below from Dr. Renard Matter.  Told patient to call us with any questions and gave Sam's number or send a message on mychart.    Tanja Port, RN

## 2022-10-09 NOTE — Telephone Encounter
-----   Message from Charmian Muff, MD sent at 10/09/2022  9:20 AM CDT -----  Jeffrey Sanders,  Your recent blood work showed normal thyroid test and prostate test.  Blood counts were fairly unremarkable.  Cholesterol numbers were normal and the HDL which is the good cholesterol was at 100 which is awesome.  Liver numbers especially AST and ALP are elevated indicating liver damage.  Would recommend avoiding any toxins affecting the liver function.  Your kidney function was fairly unremarkable.  Regards,  Dr. Renard Matter

## 2022-11-12 ENCOUNTER — Encounter: Admit: 2022-11-12 | Discharge: 2022-11-12 | Payer: BC Managed Care – HMO

## 2022-11-12 MED ORDER — VENLAFAXINE 150 MG PO CP24
ORAL_CAPSULE | 0 refills | Status: AC
Start: 2022-11-12 — End: ?

## 2022-11-12 MED ORDER — LEVOTHYROXINE 125 MCG PO TAB
125 ug | ORAL_TABLET | Freq: Every day | ORAL | 0 refills | 30.00000 days | Status: AC
Start: 2022-11-12 — End: ?

## 2022-11-12 NOTE — Telephone Encounter
Requested Prescriptions   Pending Prescriptions Disp Refills    levothyroxine (SYNTHROID) 125 mcg tablet [Pharmacy Med Name: levothyroxine 125 mcg tablet] 90 tablet 0     Sig: TAKE ONE TABLET BY MOUTH EVERY DAY 30 minutes BEFORE BREAKFAST       Endocrinology: Hypothyroid Agents Passed - 11/12/2022 10:49 AM        Passed - Valid encounter within last 12 months     Recent Visits  Date Type Provider Dept   09/28/22 Office Visit Verneda Skill, MD [facility]   01/20/21 Office Visit Verneda Skill, MD [facility]   Showing recent visits within past 720 days and meeting all other requirements  Future Appointments  Date Type Provider Dept   09/29/23 Appointment Verneda Skill, MD [facility]   Showing future appointments within next 360 days and meeting all other requirements            Passed - TSH in normal range and within 360 days     TSH   Date Value Ref Range Status   10/01/2022 1.67 0.47 - 4.68 Final   01/30/2021 0.49 0.47 - 4.68 Final                venlafaxine XR (EFFEXOR XR) 150 mg capsule [Pharmacy Med Name: venlafaxine ER 150 mg capsule,extended release 24 hr] 90 capsule 0     Sig: TAKE ONE CAPSULE BY MOUTH EVERY DAY WITH FOOD       Psychiatry: Antidepressants - SNRI - Desvenlafaxine & Venlafaxine Failed - 11/12/2022 10:49 AM        Failed - LDL in normal range and within 360 days     LDL   Date Value Ref Range Status   10/01/2022 68  Final   03/20/2022 71  Corrected              Passed - Depression Screening completed within the last 6 months        Passed - Valid encounter within last 6 months     Recent Visits  Date Type Provider Dept   09/28/22 Office Visit Verneda Skill, MD [facility]   01/20/21 Office Visit Verneda Skill, MD [facility]   Showing recent visits within past 720 days and meeting all other requirements  Future Appointments  Date Type Provider Dept   09/29/23 Appointment Verneda Skill, MD [facility]   Showing future appointments within next 360 days and meeting all other requirements            Passed - BP completed in the last 6 months     BP Readings from Last 2 Encounters:   09/28/22 123/73   03/03/22 118/76

## 2022-11-27 ENCOUNTER — Encounter: Admit: 2022-11-27 | Discharge: 2022-11-27 | Payer: BC Managed Care – HMO

## 2022-12-09 ENCOUNTER — Encounter: Admit: 2022-12-09 | Discharge: 2022-12-09 | Payer: BC Managed Care – HMO

## 2022-12-09 MED ORDER — CHLORTHALIDONE 25 MG PO TAB
25 mg | ORAL_TABLET | Freq: Every day | ORAL | 1 refills | Status: AC
Start: 2022-12-09 — End: ?

## 2022-12-09 NOTE — Telephone Encounter
1. Received e-request from pharmacy requesting new Rx for   Requested Prescriptions     Pending Prescriptions Disp Refills    chlorthalidone (HYGROTON) 25 mg tablet [Pharmacy Med Name: chlorthalidone 25 mg tablet] 90 tablet 1     Sig: TAKE ONE TABLET BY MOUTH EVERY DAY   .    2. Last Rx written: 06/12/2022  (#90 x1) by PCP    3. Last Gen Med Visit: in person  on 09/28/2022    4. Next Appt due: Provider recommended return date from last office visit: 12 months    Future Appointments   Date Time Provider Department Center   09/29/2023  1:40 PM Verneda Skill, MD [facility]       5. All protocol criteria met? Yes, last office visit assessment and plan reviewed.     Reason Protocol Failed: N/A    Courtesy Refills: N/A    Patient due for an office visit in 1,2, or 3 months 90 day supply, 0 refills   Patient due for an office visit in 4,5, or 6 months 90 day supply, 1 refill   Patient due for an office visit in 7,8, or 9 months 90 day supply, 2 refills   Patient due for an office visit in 10,11, or 12 months 90 day supply, 3 refills

## 2023-02-09 ENCOUNTER — Encounter: Admit: 2023-02-09 | Discharge: 2023-02-09 | Payer: BC Managed Care – HMO

## 2023-04-08 ENCOUNTER — Encounter: Admit: 2023-04-08 | Discharge: 2023-04-08 | Payer: BC Managed Care – HMO

## 2023-04-08 MED ORDER — ATORVASTATIN 80 MG PO TAB
80 mg | ORAL_TABLET | Freq: Every day | ORAL | 0 refills | Status: AC
Start: 2023-04-08 — End: ?

## 2023-04-24 ENCOUNTER — Encounter: Admit: 2023-04-24 | Discharge: 2023-04-24 | Payer: BC Managed Care – HMO

## 2023-05-10 ENCOUNTER — Encounter: Admit: 2023-05-10 | Discharge: 2023-05-10 | Payer: BC Managed Care – HMO

## 2023-06-08 ENCOUNTER — Encounter: Admit: 2023-06-08 | Discharge: 2023-06-08 | Payer: BLUE CROSS/BLUE SHIELD

## 2023-06-08 DIAGNOSIS — E291 Testicular hypofunction: Secondary | ICD-10-CM

## 2023-06-08 MED ORDER — OTHER MEDICATION
1 | Freq: Every day | TOPICAL | 5 refills | Status: AC
Start: 2023-06-08 — End: ?

## 2023-06-08 NOTE — Telephone Encounter
 Prescription must be called in to pharmacy  1. Received call from pharmacy requesting new Rx for   Requested Prescriptions     Pending Prescriptions Disp Refills    other medication 30 mL 5     Sig: Apply one Dose topically to affected area daily. Medication Name & Strength: Testosterone Gel 2%  Apply 4 clicks (1ML) topically daily     2. Last Rx written: 11/30/22  (# 30mL  x 5 ) by PCP    3. Last Gen Med Visit: in person  on 09/28/22    4. Next Appt due: Provider recommended return date from last office visit: 1 year    Future Appointments   Date Time Provider Department Center   09/29/2023  1:40 PM Veluri, Meena, MD [facility]     5. All protocol criteria met? No, see below     Reason Protocol Failed: Non-delegated medication, routing to provider for approval.     Courtesy Refills: N/A    Patient due for an office visit in 1,2, or 3 months 90 day supply, 0 refills   Patient due for an office visit in 4,5, or 6 months 90 day supply, 1 refill   Patient due for an office visit in 7,8, or 9 months 90 day supply, 2 refills   Patient due for an office visit in 10,11, or 12 months 90 day supply, 3 refills

## 2023-06-09 ENCOUNTER — Encounter: Admit: 2023-06-09 | Discharge: 2023-06-09 | Payer: BLUE CROSS/BLUE SHIELD

## 2023-06-09 MED ORDER — CHLORTHALIDONE 25 MG PO TAB
25 mg | ORAL_TABLET | Freq: Every day | ORAL | 1 refills | 90.00000 days | Status: AC
Start: 2023-06-09 — End: ?

## 2023-06-09 NOTE — Telephone Encounter
 1. Received e-request from pharmacy requesting new Rx for   Requested Prescriptions     Pending Prescriptions Disp Refills    chlorthalidone (HYGROTON) 25 mg tablet [Pharmacy Med Name: chlorthalidone 25 mg tablet] 90 tablet 1     Sig: TAKE ONE TABLET BY MOUTH EVERY DAY   .    2. Last Rx written: 12/09/2022  (#90 x1) by PCP    3. Last Gen Med Visit: in person  on 09/28/2022    4. Next Appt due: Provider recommended return date from last office visit: 1 year (around 09/28/2023)     Future Appointments   Date Time Provider Department Center   09/29/2023  1:40 PM Veluri, Meena, MD [facility]       5. All protocol criteria met? No, see below     Reason Protocol Failed: Labs due- routing to provider for lab orders and refill approval. , Abnormal labs- results and provider result notes included for review. Routing to provider for approval. , and Off protocol- this medication has not been assigned a protocol, routing to provider for approval.     Courtesy Refills: N/A    Patient due for an office visit in 1,2, or 3 months 90 day supply, 0 refills   Patient due for an office visit in 4,5, or 6 months 90 day supply, 1 refill   Patient due for an office visit in 7,8, or 9 months 90 day supply, 2 refills   Patient due for an office visit in 10,11, or 12 months 90 day supply, 3 refills

## 2023-07-07 ENCOUNTER — Encounter: Admit: 2023-07-07 | Discharge: 2023-07-07 | Payer: BLUE CROSS/BLUE SHIELD

## 2023-07-07 MED ORDER — ATORVASTATIN 80 MG PO TAB
ORAL_TABLET | ORAL | 0 refills | 90.00000 days | Status: AC
Start: 2023-07-07 — End: ?

## 2023-08-04 ENCOUNTER — Encounter: Admit: 2023-08-04 | Discharge: 2023-08-04 | Payer: BLUE CROSS/BLUE SHIELD

## 2023-08-04 MED ORDER — VENLAFAXINE 150 MG PO CP24
150 mg | ORAL_CAPSULE | Freq: Every day | ORAL | 0 refills | 30.00000 days | Status: AC
Start: 2023-08-04 — End: ?

## 2023-08-04 MED ORDER — LEVOTHYROXINE 125 MCG PO TAB
125 ug | ORAL_TABLET | Freq: Every day | ORAL | 0 refills | 30.00000 days | Status: AC
Start: 2023-08-04 — End: ?

## 2023-08-04 NOTE — Telephone Encounter
 1. Received e-request from pharmacy requesting new Rx for   Requested Prescriptions     Pending Prescriptions Disp Refills    venlafaxine  XR (EFFEXOR  XR) 150 mg capsule [Pharmacy Med Name: venlafaxine  ER 150 mg capsule,extended release 24 hr] 90 capsule 0     Sig: TAKE ONE CAPSULE BY MOUTH EVERY DAY WITH FOOD    levothyroxine  (SYNTHROID ) 125 mcg tablet [Pharmacy Med Name: levothyroxine  125 mcg tablet] 90 tablet 0     Sig: TAKE ONE TABLET BY MOUTH EVERY MORNING 30 MINUTES BEFORE BREAKFAST     2. Last Rx written:   Medication: 05/12/2023 -- venlafaxine  XR (EFFEXOR  XR) 150 mg capsule (#90 x0) by PCP   Medication: 05/12/2023 -- levothyroxine  (SYNTHROID ) 125 mcg tablet (#90 x0) by PCP     3. Last Gen Med Visit: in person  on 09/28/2022    4. Next Appt due: Provider recommended return date from last office visit: 1 year (around 09/28/2023)     Future Appointments   Date Time Provider Department Center   09/29/2023  1:40 PM Veluri, Meena, MD [facility]     5. All protocol criteria met? No, see below     Reason Protocol Failed:  No valid encounter within last 6 monthsAND No BP completed in the last 6 months     Courtesy Refills: N/A    Patient due for an office visit in 1,2, or 3 months 90 day supply, 0 refills   Patient due for an office visit in 4,5, or 6 months 90 day supply, 1 refill   Patient due for an office visit in 7,8, or 9 months 90 day supply, 2 refills   Patient due for an office visit in 10,11, or 12 months 90 day supply, 3 refills

## 2023-09-06 ENCOUNTER — Encounter: Admit: 2023-09-06 | Discharge: 2023-09-06 | Payer: BLUE CROSS/BLUE SHIELD

## 2023-09-06 MED ORDER — ATORVASTATIN 80 MG PO TAB
80 mg | ORAL_TABLET | Freq: Every day | ORAL | 1 refills | 90.00000 days | Status: AC
Start: 2023-09-06 — End: ?

## 2023-09-29 ENCOUNTER — Ambulatory Visit: Admit: 2023-09-29 | Discharge: 2023-09-30 | Payer: BLUE CROSS/BLUE SHIELD

## 2023-09-29 ENCOUNTER — Encounter: Admit: 2023-09-29 | Discharge: 2023-09-29 | Payer: BLUE CROSS/BLUE SHIELD

## 2023-09-29 VITALS — BP 127/68 | HR 106 | Ht 67.5 in | Wt 171.2 lb

## 2023-09-29 DIAGNOSIS — E785 Hyperlipidemia, unspecified: Secondary | ICD-10-CM

## 2023-09-29 DIAGNOSIS — R7401 Transaminitis: Secondary | ICD-10-CM

## 2023-09-29 DIAGNOSIS — Z0000 Encounter for general adult medical examination without abnormal findings: Secondary | ICD-10-CM

## 2023-09-29 DIAGNOSIS — I10 Essential (primary) hypertension: Secondary | ICD-10-CM

## 2023-09-29 DIAGNOSIS — C73 Malignant neoplasm of thyroid gland: Secondary | ICD-10-CM

## 2023-09-29 DIAGNOSIS — F419 Anxiety disorder, unspecified: Secondary | ICD-10-CM

## 2023-09-29 MED ORDER — CHLORTHALIDONE 25 MG PO TAB
25 mg | ORAL_TABLET | Freq: Every day | ORAL | 1 refills | 90.00000 days | Status: AC
Start: 2023-09-29 — End: ?

## 2023-09-29 MED ORDER — ATENOLOL 25 MG PO TAB
25 mg | ORAL_TABLET | Freq: Every day | ORAL | 3 refills | 33.00000 days | Status: AC
Start: 2023-09-29 — End: ?

## 2023-09-29 MED ORDER — VENLAFAXINE 150 MG PO CP24
150 mg | ORAL_CAPSULE | Freq: Every day | ORAL | 1 refills | 30.00000 days | Status: AC
Start: 2023-09-29 — End: ?

## 2023-09-29 MED ORDER — LEVOTHYROXINE 125 MCG PO TAB
125 ug | ORAL_TABLET | Freq: Every day | ORAL | 3 refills | 30.00000 days | Status: AC
Start: 2023-09-29 — End: ?

## 2023-09-29 NOTE — Progress Notes
 Date of Service: 09/29/2023    Subjective:             Jeffrey Sanders is a 63 y.o. male.  Mr. 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 Darius and HTN, hyperlipidemia presented for a annual physical.  He was last seen by me in Sept, 2024.  He denied having any acute concerns.He reports consuming alcohol every evening.  On average he drinks about 8-10 beers per night.He quit cold [place] in 2013 for 3 years and for the past 9 years again he has been drinking heavily.  He is not interested in going to the alcohol Anonymous or alcohol abuse program.  He has been taking venlafaxine  ,atenolol  and chlorthalidone  for hypertension and atorvastatin  for hyperlipidemia. He is currently not fasting.  Lab orders have been provided to him.  He has not seen the endocrinologist is not covered by his insurance.He does have a lesion underneath his right eye .he is not interested in seeing ophthalmology or dermatology.  Was also seen by pulmonary and had a CT scan for pulmonary nodules in the past.  Last CT scan was in 2022 and he has been declining follow-up CT scans ever since.  We did talk about repeating it and the order has been placed today.  Declined all the immunizations.  Did talk about elevated LFTs from the past and communicated with Dr. Maurice who is his cardiologist.  Levothyroxine  has been refilled.    History of Cowden's Dz (hamartoma syndrome), FAP - Familial Adenomatous Polyposis, or MEN syndrome: No  History of neck radiation exposure: No   Endocrinologist: Zhou         Past Medical History:    Alcoholism (CMS-HCC)    Anxiety    Chest pain    High blood pressure    Hypertension    Multiple thyroid nodules    Neoplasm of unspecified nature, site unspecified    Papillary carcinoma of thyroid (CMS-HCC)    Ringing in the ears    S/P radioactive iodine thyroid ablation    Unspecified endocrine disorder     Surgical History:   Procedure Laterality Date    ANGIOGRAPHY CORONARY ARTERY WITH LEFT HEART CATHETERIZATION WITH VENTRICULOGRAPHY Left 03/30/2016    Performed by Grayson Oneil DELENA, MD at [facility] CATH LAB    POSSIBLE PERCUTANEOUS CORONARY STENT PLACEMENT WITH ANGIOPLASTY N/A 03/30/2016    Performed by Grayson Oneil DELENA, MD at [facility] CATH LAB    COLONOSCOPY N/A 05/14/2016    Performed by Noralyn Blossom, MD at [facility] ENDO    COLONOSCOPY EXCISION LESION  05/14/2016    Performed by Noralyn Blossom, MD at [facility] ENDO    ESOPHAGOGASTRODUODENOSCOPY N/A 06/04/2016    Performed by Leonce Emerick PARAS, DO at [facility] ENDO    ESOPHAGOGASTRODUODENOSCOPY BIOPSY  06/04/2016    Performed by Leonce Emerick PARAS, DO at [facility] ENDO    HX THYROIDECTOMY      THYROIDECTOMY       Social History    Marital status: Single     Spouse name: N/A    Number of children: N/A    Years of education: N/A     Occupational History     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  He does have a history of smoking of 3 packs a day, he quit a few years ago.  He currently chews  tobacco. Has a history of significant alcohol use, he used to drink 14 -15 beers a night.  Patient tells me that currently he drinks 8-10 beers every night.       Social History Main Topics    Smoking status: Former Smoker     Packs/day: 1.00     Years: 26.00     Types: Cigarettes     Quit date: 01/13/2008    Smokeless tobacco: Current User     Types: Chew      Comment: Counseling given 1.31.13    Alcohol use No    Drug use: No    Sexual activity: Not on file          Review of Systems   Constitutional:  Negative for activity change, appetite change, fatigue, fever and unexpected weight change.        Flushed face appearance.   HENT:  Negative for dental problem, sore throat, tinnitus, trouble swallowing and voice change.    Eyes:  Negative for visual disturbance.   Respiratory:  Negative for shortness of breath.    Cardiovascular:  Negative for chest pain, palpitations and leg swelling.   Gastrointestinal:  Negative for abdominal pain, constipation, diarrhea and nausea.   Genitourinary:  Negative for difficulty urinating and frequency.   Musculoskeletal:  Negative for arthralgias and joint swelling.   Neurological:  Negative for dizziness, seizures and headaches.        Does report to me about possible alcohol withdrawal when he does not drink for a day.     Hematological:  Negative for adenopathy.   Psychiatric/Behavioral:  Negative for behavioral problems, decreased concentration, dysphoric mood and sleep disturbance. The patient is not nervous/anxious.      Objective:          atenolol  (TENORMIN ) 25 mg tablet Take one tablet by mouth daily.    atorvastatin  (LIPITOR) 80 mg tablet Take one tablet by mouth daily.    chlorthalidone  (HYGROTON ) 25 mg tablet Take one tablet by mouth daily.    levothyroxine  (SYNTHROID ) 125 mcg tablet Take one tablet by mouth daily 30 minutes before breakfast.    other medication Apply one Dose topically to affected area daily. Medication Name & Strength: Testosterone  Gel 2%  Apply 4 clicks ( ) topically daily    sildenafiL (VIAGRA) 25 mg tablet Take one tablet by mouth as Needed for Erectile dysfunction. Max of 1 tablet per day.    venlafaxine  XR (EFFEXOR  XR) 150 mg capsule Take one capsule by mouth daily with food.     Vitals:    09/29/23 1335   BP: 127/68   BP Source: Arm, Right Upper   Pulse: 106   SpO2: 99%   PainSc: Zero   Weight: 77.7 kg (171 lb 3.2 oz)   Height: 171.5 cm (5' 7.5)       Body mass index is 26.42 kg/m?SABRA     Physical Exam  Constitutional:       Appearance: He is ill-appearing.   HENT:      Head: Normocephalic.      Right Ear: External ear normal. Decreased hearing noted.      Left Ear: External ear normal. Decreased hearing noted.      Nose: Nose normal.      Mouth/Throat:      Mouth: Mucous membranes are moist.   Eyes:      Pupils: Pupils are equal, round, and reactive to light.   Cardiovascular:      Rate and Rhythm:  Tachycardia present.      Heart sounds: Normal heart sounds. Pulmonary:      Effort: Pulmonary effort is normal.      Breath sounds: Normal breath sounds.   Abdominal:      Palpations: Abdomen is soft.   Musculoskeletal:         General: Normal range of motion.      Cervical back: Normal range of motion.   Skin:     General: Skin is warm.   Neurological:      General: No focal deficit present.      Mental Status: He is alert and oriented to person, place, and time.   Psychiatric:         Behavior: Behavior normal.         Thought Content: Thought content normal.         Judgment: Judgment normal.       Assessment and Plan:  Simon is a 63 year old gentleman who presented for annual physical.    Age-appropriate counseling has been provided and fasting blood work has been ordered.    Anxiety; counseling has been provided and continue Effexor  XR 150 mg every day. Lexapro  and buspar  has been tried in the past.     History of T2, NX, MX papillary thyroid cancer ,left-followed closely by Endocrinology.  US  FNA 06/2009 - No lesional tissue identified, Thyroglobulin AB <20. Non detectable last year Tgb recently 02/04/10. US  L with stable2+cm remnant visible.  was seen by Dr. Zelphia in the past. On 125 mcg on synthroid .  TSH is being ordered as he has not been following with his other endocrinologist.    Hypertension Management: Continue on chlorthalidone  and atenolol  He is followed by Dr. Maurice cardiology.  BP Readings from Last 3 Encounters:   09/29/23 127/68   09/28/22 123/73   03/03/22 118/76     Male hypogonadism;  Testosterone  is not on the list of medications, encourage the patient to give us  more information.     Hyperlipidemia Management -continue on atorvastatin .  We did talk about the elevated transaminases and also communicated with the cardiologist.  Repeat lipid panel and CMP are pending.  Lab Results   Component Value Date/Time    CHOL 174 10/01/2022 12:00 AM    TRIG 79 10/01/2022 12:00 AM    HDL 100 (A) 10/01/2022 12:00 AM    LDL 68 10/01/2022 12:00 AM    VLDL 16 10/01/2022 12:00 AM    NONHDLCHOL 156 02/15/2018 10:59 AM    CHOLHDLC 2 10/01/2022 12:00 AM    ALT 69 (A) 10/01/2022 12:00 AM    CK 41 03/19/2016 12:00 AM    VITD25 >99.0 (H) 07/24/2020 02:25 PM        Imp: Hyperlipidemia fair     Plan:  Discussed labs and reviewed goals for LDL, HDL, triglycerides.   Discussed exercise management and diet with emphasis on vegetables, fruit and lean meat.  Are barriers to achieving goals present? No  Medication education provided. Patient voiced understanding? Yes  Patient able to self-manage and ready to comply?Yes  Educational resources identified? Verbal Counseling     Pulmonary nodules  CT scan pending for follow up .       Insomnia; he had self discontinued trazodone  in the past and has not been taking it.  Sleep hygiene has been discussed.  Alcohol cessation has been reinforced     Screening for prostate cancer - PSA is pending  HIV screening is negative  Hep C  is negative    Immunization; declined immunizations.     Patient Instructions   Health Screening Guidelines, Men Ages 34 to 46   Screening tests and health counseling are a key part of managing your health. A screening test is done to find disorders or diseases in people who don't have any symptoms. Screening tests are not used to diagnose. They are used to find out if more testing is needed. The goal may be to find a disease early so it can be treated with more success. Or the goal may be to find a disease early so you can make lifestyle changes. You may need regular checkups to help you reduce your risk of disease.   Below are guidelines for men ages 66 to 60. Talk with your healthcare provider. Make sure you?re up-to-date on what you need.   We understand gender is a spectrum. We may use gendered terms to talk about anatomy and health risk. Please use this information in a way that works best for you and your provider as you talk about your care.             Screening Who needs it How often   Unhealthy alcohol use  All men in this age group  At routine exams   Blood pressure All men in this age group  Once a year if your blood pressure is normal. Normal blood pressure is less than 120/80 mm Hg. If your blood pressure is higher than this, follow the advice of your healthcare provider.    Colorectal cancer All men in this age group  Talk with your healthcare provider about which test below is right for you:   Colonoscopy every 10 years  Flexible sigmoidoscopy every 5 years or every 10 years with yearly fecal immunochemical test (FIT) stool test  CT colonography (virtual colonoscopy) every 5 years  Yearly fecal occult blood test  Yearly FIT  Stool FIT-DNA test (also called the stool DNA test) every 3 years  If you have a test that is not a colonoscopy and have an abnormal test result, you will need a colonoscopy.   You may need to be screened more or less often. This is based on personal or family health history. Talk with your healthcare provider.    Depression All men in this age group  At routine exams   Type 2 diabetes or prediabetes  All men in this age group with no symptoms who are overweight or obese.  At least every 3 years (yearly if your blood sugar has already begun to rise)    Type 2 diabetes All men with prediabetes  Every year   Screening Who needs it How often    Hepatitis C All adults age 55 or older at least once in a lifetime.  Talk with your healthcare provider about your risk factors and how often to have hepatitis C screening.     High cholesterol or triglycerides  All men in this age group  About every 1 to 2 years. Expert groups vary in their advice. Talk with your healthcare provider about your risk factors and how often you should be tested.     HIV All men in this age group  At least 1 time. Talk with your healthcare provider about your risk factors. Ask if you should be tested more often.     Lung cancer All men in this age group who are in fairly good health and are at higher risk for lung  cancer, and who: Smoke or quit in the past 15 years  Have a 20-pack per year smoking history (1 pack a day for 20 years or 2 packs a day for 10 years)  Expert groups vary in their advice. Talk with your healthcare provider. Yearly lung cancer screening with a low-dose CT scan. Talk with your healthcare provider about your risk factors.     Obesity All men in this age group  At yearly routine exams     BMI (body mass index) All men in this age group Every year, to help find out if you are at a healthy weight for your height.     Prostate cancer All men in this age group, talk with your healthcare provider about risks and benefits of a digital rectal exam and prostate-specific antigen screening  At routine exams if you decide to be tested.     Syphilis Men at higher risk for infection  At routine exams. Talk with your healthcare provider.     Tuberculosis Men at higher risk for infection  Talk with your healthcare provider     Vision All men in this age group  Baseline screening at age 10. Talk with your healthcare provider about how often to have vision exams.     Health counseling  Who needs it  How often     Diet and exercise Men who are overweight or obese  When diagnosed, and then at routine exams     Sexually transmitted infection prevention  Men at higher risk for infection  At routine exams, talk with your healthcare provider     Use of tobacco and the health effects it can cause  All men in this age group  Every exam      Routine Clinic Information:     Please don't hesitate to call if you have any problems or questions. Nathanel can be reached at [phone].     You may also message us  in MyChart.     For refills on medications, please have your pharmacy fax a refill authorization request form to our office at Fax) [phone].     Please allow at least 3 business days for refill requests.     For urgent issues after business hours/weekends/holidays call [phone] and request for the outpatient internal medicine physician to be paged .    We offer same day appointments for your acute health concerns. These appointments are on a first come, first serve basis.     Please call [phone] if you would like to make an appointment. If I am not available, you can see any of my partners or try to see me the next day (call at 8am).     See you in  12 months     Orders Placed This Encounter    CT CHEST WO CONTRAST    CBC And Diff    Comprehensive Metabolic Panel    Lipid Profile    PSA Screen    TSH With Free T4 Reflex    Hemoglobin A1C    venlafaxine  XR (EFFEXOR  XR) 150 mg capsule    atenolol  (TENORMIN ) 25 mg tablet    levothyroxine  (SYNTHROID ) 125 mcg tablet    chlorthalidone  (HYGROTON ) 25 mg tablet      Future Appointments   Date Time Provider Department Center   11/09/2023  3:40 PM Love, Elsie DASEN, MD [facility] Exam        Take care,    Dr.Dontrelle Mazon & Nathanel       *  Dictated with Animal nutritionist, please forgive typos    Total of 40 minutes were spent on the same day of the visit including preparing to see the patient, obtaining and/or reviewing separately obtained history, performing a medically appropriate examination and/or evaluation, counseling and educating the patient/family/caregiver, ordering medications, tests, or procedures, referring and communication with other health care professionals, documenting clinical information in the electronic or other health record, independently interpreting results and communicating results to the patient/family/caregiver, and care coordination.

## 2023-09-30 DIAGNOSIS — Z125 Encounter for screening for malignant neoplasm of prostate: Secondary | ICD-10-CM

## 2023-09-30 DIAGNOSIS — R7301 Impaired fasting glucose: Secondary | ICD-10-CM

## 2023-09-30 DIAGNOSIS — R918 Other nonspecific abnormal finding of lung field: Principal | ICD-10-CM

## 2023-09-30 DIAGNOSIS — F32A Depression, unspecified: Secondary | ICD-10-CM

## 2023-10-15 ENCOUNTER — Encounter: Admit: 2023-10-15 | Discharge: 2023-10-15 | Payer: BLUE CROSS/BLUE SHIELD

## 2023-10-15 ENCOUNTER — Ambulatory Visit: Admit: 2023-10-15 | Discharge: 2023-10-15 | Payer: BLUE CROSS/BLUE SHIELD

## 2023-10-18 ENCOUNTER — Encounter: Admit: 2023-10-18 | Discharge: 2023-10-18 | Payer: BLUE CROSS/BLUE SHIELD

## 2023-10-18 DIAGNOSIS — R911 Solitary pulmonary nodule: Principal | ICD-10-CM

## 2023-10-21 ENCOUNTER — Encounter: Admit: 2023-10-21 | Discharge: 2023-10-21 | Payer: BLUE CROSS/BLUE SHIELD

## 2023-10-26 LAB — CBC
HEMATOCRIT: 46
HEMOGLOBIN: 16
MCH: 34 — ABNORMAL HIGH (ref 25.6–32.2)
MCHC: 35
MCV: 96 — ABNORMAL HIGH (ref 79.0–92.2)
MPV: 9.8
PLATELET COUNT: 165
RBC COUNT: 4.7
RDW: 12
WBC COUNT: 4.1 — ABNORMAL LOW (ref 4.23–9.07)

## 2023-10-26 LAB — COMPREHENSIVE METABOLIC PANEL
ALBUMIN: 5.1 — ABNORMAL HIGH (ref 3.5–5.0)
ALK PHOSPHATASE: 97
AST: 74 — ABNORMAL HIGH (ref 17–59)
BLD UREA NITROGEN: 11
CALCIUM: 9.2
CHLORIDE: 95 — ABNORMAL LOW (ref 98–107)
CO2: 27
CREATININE: 1
GLUCOSE,PANEL: 120 — ABNORMAL HIGH (ref 74–100)
POTASSIUM: 3.6
SODIUM: 133 — ABNORMAL LOW (ref 137–145)
TOTAL BILIRUBIN: 1
TOTAL PROTEIN: 8.1

## 2023-10-26 LAB — LIPID PROFILE
CHOLESTEROL/HDL %: 2
CHOLESTEROL: 166
HDL: 95 — ABNORMAL HIGH (ref 40–60)
LDL: 58
TRIGLYCERIDES: 100
VLDL: 20

## 2023-10-26 LAB — PSA SCREEN: PSA SCREEN: 3.5

## 2023-10-26 LAB — HEMOGLOBIN A1C: HEMOGLOBIN A1C: 4.9

## 2023-10-26 LAB — TSH WITH FREE T4 REFLEX: TSH: 3.6

## 2023-11-01 ENCOUNTER — Encounter: Admit: 2023-11-01 | Discharge: 2023-11-01 | Payer: BLUE CROSS/BLUE SHIELD

## 2023-11-01 DIAGNOSIS — R911 Solitary pulmonary nodule: Principal | ICD-10-CM

## 2023-11-01 NOTE — Telephone Encounter
-----   Message from [place] K sent at 11/01/2023 11:33 AM CDT -----  Regarding: No labs  Hello,     We are still waiting for this patient to have his ordered labs (CBC/INR) completed before we can schedule his lung biopsy. Can someone from your office please contact the patient?    Thank you,   Larraine, RN

## 2023-11-01 NOTE — Telephone Encounter
 Contacted patient. Labs were drawn at [facility] and have been scanned under the Media tab. I contacted the lab and confirmed that no INR was drawn.  Nathanel Murray, LPN

## 2023-11-01 NOTE — Telephone Encounter
 Nathanel,     Can you please contact Srijan and let him know to go ahead and get the blood work done so that lung biopsy can be scheduled for further evaluation of the abnormality that was found on the imaging.  Please reinforce   the importance of getting this done in a timely fashion.    Regards,  Dr. Gemma

## 2023-11-01 NOTE — Telephone Encounter
 Returned call to patient. Left voicemail with information regarding additional lab orders. Patient was encouraged to inform us  whether labs will be drawn through [place] or in [place].    Nathanel Murray, LPN

## 2023-11-09 ENCOUNTER — Encounter: Admit: 2023-11-09 | Discharge: 2023-11-09 | Payer: BLUE CROSS/BLUE SHIELD

## 2023-11-09 DIAGNOSIS — E785 Hyperlipidemia, unspecified: Secondary | ICD-10-CM

## 2023-11-09 DIAGNOSIS — I10 Essential (primary) hypertension: Secondary | ICD-10-CM

## 2023-11-09 DIAGNOSIS — R7301 Impaired fasting glucose: Secondary | ICD-10-CM

## 2023-11-09 DIAGNOSIS — Z0000 Encounter for general adult medical examination without abnormal findings: Secondary | ICD-10-CM

## 2023-11-09 DIAGNOSIS — R911 Solitary pulmonary nodule: Principal | ICD-10-CM

## 2023-11-09 DIAGNOSIS — Z125 Encounter for screening for malignant neoplasm of prostate: Secondary | ICD-10-CM

## 2023-11-09 DIAGNOSIS — C73 Malignant neoplasm of thyroid gland: Secondary | ICD-10-CM

## 2023-11-11 ENCOUNTER — Encounter: Admit: 2023-11-11 | Discharge: 2023-11-11 | Payer: BLUE CROSS/BLUE SHIELD

## 2023-11-11 NOTE — Telephone Encounter [36]
 Contacted Ulus to discuss about the lung biopsy as it is very important to further investigate to appropriately treat the concerning findings on the CT scan.  He did get his recent blood work which was reviewed briefly with him.  He denied having any questions.  He did not get the pro time done yet.  He does not have the interventional radiology number.  Jeffrey Sanders my nurse will be contacting him with the scheduling number and we will follow-up with him early next week to see if he has scheduled the appointment.  Thank you

## 2023-11-11 NOTE — Telephone Encounter [36]
 Call placed to pt providing IR #    Nathanel Murray, LPN

## 2023-11-12 ENCOUNTER — Encounter: Admit: 2023-11-12 | Discharge: 2023-11-12 | Payer: BLUE CROSS/BLUE SHIELD

## 2023-11-12 ENCOUNTER — Ambulatory Visit: Admit: 2023-11-12 | Discharge: 2023-11-13 | Payer: BLUE CROSS/BLUE SHIELD

## 2023-11-22 ENCOUNTER — Encounter: Admit: 2023-11-22 | Discharge: 2023-11-22

## 2023-11-22 ENCOUNTER — Ambulatory Visit: Admit: 2023-11-22 | Discharge: 2023-11-22

## 2023-11-26 ENCOUNTER — Encounter: Admit: 2023-11-26 | Discharge: 2023-11-26

## 2023-11-26 DIAGNOSIS — C3490 Malignant neoplasm of unspecified part of unspecified bronchus or lung: Principal | ICD-10-CM

## 2023-11-26 NOTE — Telephone Encounter [36]
 Pt called requesting results of biopsy from earlier this week.

## 2023-11-26 NOTE — Telephone Encounter [36]
 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  Already expecting that result and was 0.25.  Evaluation and treatment and referral to hematology oncology has been placed.

## 2023-12-01 ENCOUNTER — Encounter: Admit: 2023-12-01 | Discharge: 2023-12-01

## 2023-12-02 ENCOUNTER — Encounter: Admit: 2023-12-02 | Discharge: 2023-12-02

## 2023-12-28 ENCOUNTER — Encounter: Admit: 2023-12-28 | Discharge: 2023-12-28

## 2023-12-28 VITALS — BP 130/75 | HR 86 | Temp 97.90000°F | Resp 16 | Ht 69.291 in | Wt 167.4 lb

## 2023-12-28 DIAGNOSIS — C3412 Malignant neoplasm of upper lobe, left bronchus or lung: Principal | ICD-10-CM

## 2023-12-28 NOTE — Progress Notes [1]
 Name: Jeffrey Sanders          MRN: [id number]      DOB: 1960/08/28      AGE: 63 y.o.   DATE OF SERVICE: 12/28/2023    Subjective:             Reason for Visit:  New Patient      Jeffrey Sanders is a 63 y.o. male who presents to my oncology clinic upon referral for lung cancer.    Oncology history:  Lung cancer         Per notes, he was also seen by pulmonary and had a CT scan for pulmonary nodules in the past.  Last CT scan was in 2022 and he has been declining follow-up CT scans ever since.   10/15/23 - CT chest:  Increased size of an irregular nodule in the left upper lobe with cavitation or cystic spaces compatible with an invasive primary lung cancer.  Other small lung nodules are stable since 2022 and favored to be benign.  No thoracic lymphadenopathy.  Per the report irregular nodule in the left upper lobe central cavitation or cystic airspace is increased in size now measuring 1.4 x 1.3 cm previously 1.1 x 0.6 cm.  Other small pulmonary nodules including a 5 mm ground glass nodule in the left upper lobe are stable    11/22/23 - CT lung biopsy, LUL path: pulmonary Adenocarcinoma    PDL1 TPS less than 1%       Prior Treatment (XRT, Surgery, Chemotherapy):    Pt reports h/o thyroid cancer, treated in 2007 with surgery and radiation.  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         History of Present Illness  Past Medical History:    Past Medical History:    Alcoholism (CMS-HCC)    Anxiety    Chest pain    High blood pressure    Hypertension    Multiple thyroid nodules    Neoplasm of unspecified nature, site unspecified    Papillary carcinoma of thyroid (CMS-HCC)    Ringing in the ears    S/P radioactive iodine thyroid ablation    Unspecified endocrine disorder       Past Surgical History:   Surgical History:   Procedure Laterality Date    ANGIOGRAPHY CORONARY ARTERY WITH LEFT HEART CATHETERIZATION WITH VENTRICULOGRAPHY Left 03/30/2016    Performed by Grayson Oneil DELENA, MD at [facility] CATH LAB    POSSIBLE PERCUTANEOUS CORONARY STENT PLACEMENT WITH ANGIOPLASTY N/A 03/30/2016    Performed by Grayson Oneil DELENA, MD at [facility] CATH LAB    COLONOSCOPY N/A 05/14/2016    Performed by Noralyn Blossom, MD at [facility] ENDO    COLONOSCOPY EXCISION LESION  05/14/2016    Performed by Noralyn Blossom, MD at [facility] ENDO    ESOPHAGOGASTRODUODENOSCOPY N/A 06/04/2016    Performed by Leonce Emerick PARAS, DO at [facility] ENDO    ESOPHAGOGASTRODUODENOSCOPY BIOPSY  06/04/2016    Performed by Leonce Emerick PARAS, DO at [facility] ENDO    HX THYROIDECTOMY      THYROIDECTOMY         Transfusion History: No    Medication list: Current Medications[1]    Allergy List: Allergies[2]    Social History: Social History[3]    Family History:   Family History   Problem Relation Name Age  of Onset    Hypertension Mother      Cancer Father      Kidney Disease Father      Other Father      Cancer Paternal Uncle      Diabetes Paternal Grandmother       Father with prostate cancer and bladder  Uncle had lung ccancer       Review of Systems   Constitutional:  Negative for chills, diaphoresis, fatigue, fever and unexpected weight change.   HENT:  Negative for congestion.    Eyes:  Negative for visual disturbance.   Respiratory:  Negative for cough and shortness of breath.    Cardiovascular:  Negative for chest pain.   Gastrointestinal:  Negative for constipation and diarrhea.   Genitourinary:  Negative for difficulty urinating.   Musculoskeletal:  Negative for arthralgias, back pain and myalgias.   Skin:  Negative for rash.   Neurological:  Negative for headaches.   Hematological:  Negative for adenopathy. Does not bruise/bleed easily.   Psychiatric/Behavioral: Negative.         Objective   Objective:          atenolol  (TENORMIN ) 25 mg tablet Take one tablet by mouth daily.    atorvastatin  (LIPITOR) 80 mg tablet Take one tablet by mouth daily.    chlorthalidone  (HYGROTON ) 25 mg tablet Take one tablet by mouth daily.    levothyroxine  (SYNTHROID ) 125 mcg tablet Take one tablet by mouth daily 30 minutes before breakfast.    other medication Apply one Dose topically to affected area daily. Medication Name & Strength: Testosterone  Gel 2%  Apply 4 clicks ( ) topically daily    sildenafiL (VIAGRA) 25 mg tablet Take one tablet by mouth as Needed for Erectile dysfunction. Max of 1 tablet per day.    venlafaxine  XR (EFFEXOR  XR) 150 mg capsule Take one capsule by mouth daily with food.     Vitals:    12/28/23 0939   BP: 130/75   BP Source: Arm, Right Upper   Pulse: 86   Temp: 36.6 ?C (97.9 ?F)   Resp: 16   SpO2: 99%   TempSrc: Temporal   PainSc: Zero   Weight: 75.9 kg (167 lb 6.4 oz)   Height: 176 cm (5' 9.29)  Comment: with boots     Body mass index is 24.51 kg/m?SABRA     Pain Score: Zero       Fatigue Scale: 0-None    Pain Addressed:  N/A    Patient Evaluated for a Clinical Trial: No treatment clinical trial available for this patient.     Eastern Cooperative Oncology Group performance status is 0, Fully active, able to carry on all pre-disease performance without restriction.SABRA     Physical Exam  Constitutional:       Appearance: He is well-developed.   HENT:      Head: Normocephalic and atraumatic.   Eyes:      Pupils: Pupils are equal, round, and reactive to light.   Cardiovascular:      Rate and Rhythm: Normal rate and regular rhythm.   Pulmonary:      Effort: Pulmonary effort is normal.      Breath sounds: Normal breath sounds. No wheezing.   Abdominal:      General: Bowel sounds are normal.      Palpations: Abdomen is soft. There is no mass.   Musculoskeletal:      Cervical back: Neck supple.      Right lower leg:  No edema.      Left lower leg: No edema.   Skin:     Findings: No rash.   Neurological:      Mental Status: He is alert and oriented to person, place, and time.      Cranial Nerves: No cranial nerve deficit.                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  Once I have the staging known I will have a more concrete treatment plan.  In the interim, I will refer to thoracic surgery and radiation oncology as these could be viable options to treatment given early stage lung cancer.  Thyroid cancer; T2 NX MX papillary thyroid cancer-he is on Synthroid  125 mcg p.o. daily he was treated in 2007 with surgery radioactive iodine.  Tic-I will have the patient return to clinic in 2 weeks  I answered the patient's questions to his satisfaction.  I spent of this visit in which at least 50% of it was face to face consultation with the patient.                               [1]   Current Outpatient Medications:     atenolol  (TENORMIN ) 25 mg tablet, Take one tablet by mouth daily., Disp: 90 tablet, Rfl: 3    atorvastatin  (LIPITOR) 80 mg tablet, Take one tablet by mouth daily., Disp: 90 tablet, Rfl: 1    chlorthalidone  (HYGROTON ) 25 mg tablet, Take one tablet by mouth daily., Disp: 90 tablet, Rfl: 1    levothyroxine  (SYNTHROID ) 125 mcg tablet, Take one tablet by mouth daily 30 minutes before breakfast., Disp: 90 tablet, Rfl: 3    other medication, Apply one Dose topically to affected area daily. Medication Name & Strength: Testosterone  Gel 2%  Apply 4 clicks (1ML) topically daily, Disp: 30 mL, Rfl: 5    sildenafiL (VIAGRA) 25 mg tablet, Take one tablet by mouth as Needed for Erectile dysfunction. Max of 1 tablet per day., Disp: 10 tablet, Rfl: 0    venlafaxine  XR (EFFEXOR  XR) 150 mg capsule, Take one capsule by mouth daily with food., Disp: 90 capsule, Rfl: 1  [2] No Known Allergies  [3]   Social History  Socioeconomic History    Marital status: Single   Tobacco Use    Smoking status: Former     Current packs/day: 0.00     Average packs/day: 1 pack/day for 26.0 years (26.0 ttl pk-yrs)     Types: Cigarettes     Start date: 01/12/1982     Quit date: 01/13/2008     Years since quitting: 15.9 Smokeless tobacco: Current     Types: Chew    Tobacco comments:     Counseling given 1.31.13   Vaping Use    Vaping status: Former   Substance and Sexual Activity    Alcohol use: Yes     Comment: 8 beers a night     Drug use: No

## 2024-01-03 ENCOUNTER — Encounter: Admit: 2024-01-03 | Discharge: 2024-01-03

## 2024-01-11 ENCOUNTER — Encounter: Admit: 2024-01-11 | Discharge: 2024-01-11 | Payer: PRIVATE HEALTH INSURANCE

## 2024-01-14 ENCOUNTER — Encounter: Admit: 2024-01-14 | Discharge: 2024-01-14 | Payer: PRIVATE HEALTH INSURANCE

## 2024-01-17 ENCOUNTER — Ambulatory Visit: Admit: 2024-01-17 | Discharge: 2024-01-17 | Payer: PRIVATE HEALTH INSURANCE

## 2024-01-17 ENCOUNTER — Encounter: Admit: 2024-01-17 | Discharge: 2024-01-17 | Payer: PRIVATE HEALTH INSURANCE

## 2024-01-18 ENCOUNTER — Encounter: Admit: 2024-01-18 | Discharge: 2024-01-18 | Payer: PRIVATE HEALTH INSURANCE

## 2024-01-18 ENCOUNTER — Ambulatory Visit: Admit: 2024-01-18 | Discharge: 2024-01-18 | Payer: PRIVATE HEALTH INSURANCE

## 2024-01-18 VITALS — BP 126/75 | HR 100 | Temp 98.20000°F | Resp 18 | Wt 171.2 lb

## 2024-01-18 DIAGNOSIS — C3412 Malignant neoplasm of upper lobe, left bronchus or lung: Principal | ICD-10-CM

## 2024-01-18 NOTE — Progress Notes [1]
 Name: Jeffrey Sanders          MRN: [id number]      DOB: 1960/11/23      AGE: 64 y.o.   DATE OF SERVICE: 01/18/2024    Subjective:             Reason for Visit:  No chief complaint on file.      Jeffrey Sanders is a 64 y.o. male who presents to my oncology clinic for follow-up for lung cancer.      History of Present Illness  Oncology history:  Lung cancer     Per notes, he was also seen by pulmonary and had a CT scan for pulmonary nodules in the past.  Last CT scan was in 2022 and he has been declining follow-up CT scans ever since.   10/15/23 - CT chest:  Increased size of an irregular nodule in the left upper lobe with cavitation or cystic spaces compatible with an invasive primary lung cancer.  Other small lung nodules are stable since 2022 and favored to be benign.  No thoracic lymphadenopathy.  Per the report irregular nodule in the left upper lobe central cavitation or cystic airspace is increased in size now measuring 1.4 x 1.3 cm previously 1.1 x 0.6 cm.  Other small pulmonary nodules including a 5 mm ground glass nodule in the left upper lobe are stable    11/22/23 - CT lung biopsy, LUL path: pulmonary Adenocarcinoma    PDL1 TPS less than 1%       Prior Treatment (XRT, Surgery, Chemotherapy):    Pt reports h/o thyroid cancer, treated in 2007 with surgery and radiation.  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     MRI of the brain with and without contrast from January 14, 2024:  No evidence of intracranial metastatic disease.  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    PET scan from January 17, 2024:  No significant hypermetabolic uptake in the otherwise grossly stable biopsy-proven left upper lobe adenocarcinoma.  No enlarged or hypermetabolic thoracic lymphadenopathy. Mild diffuse hypermetabolic uptake throughout the colon which is nonspecific but can be seen with colitis.           Review of Systems   Constitutional:  Negative for chills, diaphoresis, fatigue, fever and unexpected weight change.   HENT:  Negative for congestion.    Eyes:  Negative for visual disturbance.   Respiratory:  Negative for cough and shortness of breath.    Cardiovascular:  Negative for chest pain.   Gastrointestinal:  Negative for constipation and diarrhea.   Genitourinary:  Negative for difficulty urinating.   Musculoskeletal:  Negative for arthralgias, back pain and myalgias.   Skin:  Negative for rash.   Neurological:  Negative for headaches.   Hematological:  Negative for adenopathy. Does not bruise/bleed easily.   Psychiatric/Behavioral: Negative.         Objective   Objective:          atenolol  (TENORMIN ) 25 mg tablet Take one tablet by mouth daily.    atorvastatin  (LIPITOR) 80 mg tablet Take one tablet by mouth daily.    chlorthalidone  (HYGROTON ) 25 mg tablet Take one tablet by mouth daily.    levothyroxine  (SYNTHROID ) 125 mcg tablet Take one tablet by mouth daily 30 minutes before  breakfast.    other medication Apply one Dose topically to affected area daily. Medication Name & Strength: Testosterone  Gel 2%  Apply 4 clicks ( ) topically daily    sildenafiL (VIAGRA) 25 mg tablet Take one tablet by mouth as Needed for Erectile dysfunction. Max of 1 tablet per day.    venlafaxine  XR (EFFEXOR  XR) 150 mg capsule Take one capsule by mouth daily with food.     There were no vitals filed for this visit.    There is no height or weight on file to calculate BMI.                  Pain Addressed:  N/A    Patient Evaluated for a Clinical Trial: No treatment clinical trial available for this patient.     Eastern Cooperative Oncology Group performance status is 0, Fully active, able to carry on all pre-disease performance without restriction.SABRA     Physical Exam  Constitutional:       Appearance: He is well-developed.   HENT:      Head: Normocephalic and atraumatic.   Eyes:      Pupils: Pupils are equal, round, and reactive to light.   Cardiovascular:      Rate and Rhythm: Normal rate and regular rhythm.   Pulmonary:      Effort: Pulmonary effort is normal.      Breath sounds: Normal breath sounds. No wheezing.   Abdominal:      General: Bowel sounds are normal.      Palpations: Abdomen is soft. There is no mass.   Musculoskeletal:      Cervical back: Neck supple.      Right lower leg: No edema.      Left lower leg: No edema.   Skin:     Findings: No rash.   Neurological:      Mental Status: He is alert and oriented to person, place, and time.      Cranial Nerves: No cranial nerve deficit.                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  He does not need any neoadjuvant chemotherapy.  Thyroid cancer; T2 NX MX papillary thyroid cancer-he is on Synthroid  125 mcg p.o. daily he was treated in 2007 with surgery radioactive iodine.    Return to clinic-I will have the patient return to clinic in 3 weeks.    I answered the patient's questions to his satisfaction.  I spent 40 (dictated 405 minutes in last note) minutes of this visit in which at least 50% of it was face to face consultation with the patient.

## 2024-01-18 NOTE — Progress Notes [1]
 Social Work Case Management Note    PLAN:  Complete new patient assessment & provide resources. Pt Dx with lung cancer, Hx of thyroid cancer.      INTERVENTIONS:  SW reviewed chart in depth, met with patient, introduced self, and explained SW role. Pt at appt with support person. Pt is single, lives in [place], and is independent with ADLs & ambulation. Pt lists sister, daughter, S/O in support system. Pt is self employed. After speaking with patient, no known care, support, transportation, or housing issues at this time. Pt doe shave financial concerns.     Pt has [employer] with Rx coverage. Financial concerns currently. SW did provide [place] Billing information/number to apply for [place] financial assistance/payment plan for [place] medical bills if needed. They have already applied with them for charity care, application pending. We discussed grants- SW checked PAN, PAF, & Good Days- lung cancer for all 3 currently closed. SW did provide information on [employer] as well. SW also discussed SSDI eligibility requirements, [employer], & [employer] website or office for more in depth information on SSDI.     Pt is former smoker, drinks alcohol (chart lists possible past alcoholism?), no drugs or mental health issues. SW did provide information about  [employer] support/educational programs & our oncopsychology program.     SW provided information on transportation & other support services (life alert, in home help, meals on wheels, etc.) through [employer] on Aging, free lodging through [facility] & provided copies of all resources including list of available services that SW could provide at any time during Tx.     No other known needs at this time and SW encouraged them to contact this SW at any time when concerns came up, SW was here to provide support in any way needed. SW also provided patient with SW contact information.       Macario Elbe,  LMSW  Social Work Case Manager  Phone: [phone]

## 2024-01-19 ENCOUNTER — Encounter: Admit: 2024-01-19 | Discharge: 2024-01-19 | Payer: PRIVATE HEALTH INSURANCE

## 2024-01-19 ENCOUNTER — Ambulatory Visit: Admit: 2024-01-19 | Discharge: 2024-01-19 | Payer: PRIVATE HEALTH INSURANCE

## 2024-01-19 ENCOUNTER — Ambulatory Visit: Admit: 2024-01-19 | Discharge: 2024-01-20 | Payer: PRIVATE HEALTH INSURANCE

## 2024-01-20 ENCOUNTER — Encounter: Admit: 2024-01-20 | Discharge: 2024-01-20 | Payer: PRIVATE HEALTH INSURANCE

## 2024-01-21 ENCOUNTER — Encounter: Admit: 2024-01-21 | Discharge: 2024-01-21 | Payer: PRIVATE HEALTH INSURANCE

## 2024-01-24 ENCOUNTER — Encounter: Admit: 2024-01-24 | Discharge: 2024-01-24 | Payer: PRIVATE HEALTH INSURANCE

## 2024-01-25 ENCOUNTER — Encounter: Admit: 2024-01-25 | Discharge: 2024-01-25 | Payer: PRIVATE HEALTH INSURANCE

## 2024-01-25 NOTE — Telephone Encounter [36]
 Patient's spouse Powell called to reschedule patient's pre-op appointment as he is sick today. Appointment rescheduled for 1/15. CNC advised Heather to call back if patient still isn't feeling well and we can get his pre -op appointment and surgery for next week rescheduled. She verbalized understanding. Leonor Coke, RN

## 2024-01-27 ENCOUNTER — Encounter: Admit: 2024-01-27 | Discharge: 2024-01-27 | Payer: PRIVATE HEALTH INSURANCE

## 2024-01-27 ENCOUNTER — Ambulatory Visit: Admit: 2024-01-27 | Discharge: 2024-01-27 | Payer: PRIVATE HEALTH INSURANCE

## 2024-01-27 ENCOUNTER — Ambulatory Visit: Admit: 2024-01-27 | Discharge: 2024-01-28 | Payer: PRIVATE HEALTH INSURANCE

## 2024-01-27 NOTE — Progress Notes [1]
 Pre-op education appt complete with pt and Andi, sister; who will be w/ pt on DOS.  Informed of current visitor policy.  Reviewed and provided written instructions.  Instructed pt to be NPO @ 2300 the noc before surgery; except for a sip of water to take any meds that the [facility] pharmacist or surgeon's office instructs pt to take on the morning of surgery.  As per written instructions, pt to take 2 preop showers w/ provided CHG 4% soap prior to coming to the hospital for surgery.  Instructed to check in at [facility]missions at [facility] on DOS. Discussed process for pre-op, intra-op and post-op recovery.  Discussed post-op pain, and importance of mobilization and s/s of infection after discharge.  Instructed pt to call the CTS office if having any health changes prior to surgery.  Pt verbalized understanding of all instructions.  Denies skin or dental issues; denies sensitivity to metals. Pt denies current smoking (Quit 2010).    All questions answered to the pt's satisfaction. Escorted to [facility] Admitting for pre-registration and to [facility]. Preop labs to be collected by [facility].   AF

## 2024-02-01 ENCOUNTER — Encounter: Admit: 2024-02-01 | Discharge: 2024-02-01 | Payer: PRIVATE HEALTH INSURANCE

## 2024-02-07 ENCOUNTER — Encounter: Admit: 2024-02-07 | Discharge: 2024-02-07 | Payer: PRIVATE HEALTH INSURANCE

## 2024-02-07 ENCOUNTER — Inpatient Hospital Stay: Admit: 2024-02-07 | Discharge: 2024-02-07 | Payer: PRIVATE HEALTH INSURANCE

## 2024-02-07 ENCOUNTER — Inpatient Hospital Stay
Admission: RE | Admit: 2024-02-07 | Discharge: 2024-02-09 | Disposition: A | Discharge status: Home / Self Care | Payer: PRIVATE HEALTH INSURANCE

## 2024-02-07 LAB — BASIC METABOLIC PANEL
~~LOC~~ BKR ANION GAP: 10 (ref 3–12)
~~LOC~~ BKR CHLORIDE: 102 mmol/L (ref 98–110)
~~LOC~~ BKR GLOMERULAR FILTRATION RATE (GFR): >60 mL/min (ref >60)

## 2024-02-07 LAB — TYPE & CROSSMATCH
~~LOC~~ BKR ANTIBODY SCREEN: NEGATIVE mg/dL (ref 0.2–1.3)
~~LOC~~ BKR UNITS ORDERED: 0 U/L — ABNORMAL HIGH (ref 25–110)

## 2024-02-07 LAB — CBC
~~LOC~~ BKR HEMATOCRIT: 44 % (ref 40.0–50.0)
~~LOC~~ BKR HEMOGLOBIN: 15 g/dL (ref 13.5–16.5)
~~LOC~~ BKR MCH: 33 pg — ABNORMAL HIGH (ref 26.0–34.0)
~~LOC~~ BKR MCHC: 34 g/dL (ref 32.0–36.0)
~~LOC~~ BKR MPV: 7.8 fL (ref 7.0–11.0)
~~LOC~~ BKR PLATELET COUNT: 214 10*3/uL — ABNORMAL LOW (ref 150–400)
~~LOC~~ BKR RBC COUNT: 4.5 10*6/uL (ref 4.40–5.50)
~~LOC~~ BKR RDW: 13 % (ref 11.0–15.0)
~~LOC~~ BKR WBC COUNT: 7.7 10*3/uL (ref 4.50–11.00)

## 2024-02-08 ENCOUNTER — Inpatient Hospital Stay: Admit: 2024-02-08 | Discharge: 2024-02-08 | Payer: PRIVATE HEALTH INSURANCE

## 2024-02-08 LAB — CBC
~~LOC~~ BKR HEMATOCRIT: 41 % (ref 40.0–50.0)
~~LOC~~ BKR MCH: 34 pg (ref 26.0–34.0)
~~LOC~~ BKR MCHC: 34 g/dL (ref 32.0–36.0)
~~LOC~~ BKR MCV: 99 fL — ABNORMAL HIGH (ref 80.0–100.0)
~~LOC~~ BKR MPV: 8.4 fL — ABNORMAL HIGH (ref 7.0–11.0)
~~LOC~~ BKR RBC COUNT: 4.1 10*6/uL — ABNORMAL LOW (ref 4.40–5.50)
~~LOC~~ BKR RDW: 13 % — ABNORMAL LOW (ref 11.0–15.0)
~~LOC~~ BKR WBC COUNT: 8.4 10*3/uL (ref 4.50–11.00)

## 2024-02-08 LAB — BASIC METABOLIC PANEL
~~LOC~~ BKR CO2: 29 mmol/L (ref 21–30)
~~LOC~~ BKR GLOMERULAR FILTRATION RATE (GFR): >60 mL/min (ref >60–5.50)
~~LOC~~ BKR POTASSIUM: 3.8 mmol/L (ref 3.5–5.1)

## 2024-02-08 LAB — ALCOHOL LEVEL: ~~LOC~~ BKR ALCOHOL: <10 mg/dL (ref <10)

## 2024-02-09 ENCOUNTER — Encounter: Admit: 2024-02-09 | Discharge: 2024-02-09 | Payer: PRIVATE HEALTH INSURANCE

## 2024-02-09 ENCOUNTER — Inpatient Hospital Stay: Admit: 2024-02-09 | Discharge: 2024-02-09 | Payer: PRIVATE HEALTH INSURANCE

## 2024-02-09 LAB — CBC
~~LOC~~ BKR MCHC: 33 g/dL (ref 32.0–36.0)
~~LOC~~ BKR MPV: 8.3 fL — ABNORMAL HIGH (ref 7.0–11.0)
~~LOC~~ BKR PLATELET COUNT: 136 10*3/uL — ABNORMAL LOW (ref 150–400)
~~LOC~~ BKR RBC COUNT: 3.6 10*6/uL — ABNORMAL LOW (ref 4.40–5.50)
~~LOC~~ BKR RDW: 13 % (ref 11.0–15.0)
~~LOC~~ BKR WBC COUNT: 6 10*3/uL (ref 4.50–11.00)

## 2024-02-09 LAB — BASIC METABOLIC PANEL
~~LOC~~ BKR ANION GAP: 7 (ref 3–12)
~~LOC~~ BKR CHLORIDE: 97 mmol/L — ABNORMAL LOW (ref 98–110)
~~LOC~~ BKR GLOMERULAR FILTRATION RATE (GFR): >60 mL/min (ref >60–4.5)
~~LOC~~ BKR GLUCOSE, RANDOM: 107 mg/dL — ABNORMAL HIGH (ref 70–100)
~~LOC~~ BKR POTASSIUM: 3.3 mmol/L — ABNORMAL LOW (ref 3.5–5.1)
~~LOC~~ BKR SODIUM, SERUM: 139 mmol/L — ABNORMAL LOW (ref 137–147)

## 2024-02-09 LAB — POTASSIUM: ~~LOC~~ BKR POTASSIUM: 3.9 mmol/L (ref 3.5–5.1)

## 2024-02-09 MED FILL — METHOCARBAMOL 750 MG PO TAB: 750 mg | ORAL | 10 days supply | Qty: 30 | Fill #0 | Status: CP

## 2024-02-09 MED FILL — GABAPENTIN 300 MG PO CAP: 300 mg | ORAL | 15 days supply | Qty: 30 | Fill #0 | Status: CP

## 2024-02-09 MED FILL — OXYCODONE 5 MG PO TAB: 5 mg | ORAL | 4 days supply | Qty: 30 | Fill #0 | Status: CP

## 2024-02-10 ENCOUNTER — Encounter: Admit: 2024-02-10 | Discharge: 2024-02-10 | Payer: PRIVATE HEALTH INSURANCE

## 2024-02-10 NOTE — Telephone Encounter [36]
 Testosterone  is not on the list of medications, encourage the patient to give us  more information like the dosage of the medication and how long he has been using it and who has been prescribing it to him.  Regards,  Dr. Gemma

## 2024-02-10 NOTE — Telephone Encounter [36]
 Hospital Discharge Follow Up      Reached Patient: Yes, patient was identified, for their safety, using dual identification of name and date of birth  Patient Date of Birth: January 27, 1960     Admission Information:     Hospital Name: [facility]  Admission Date: 02/07/24    Discharge Date: 02/09/24    Admission Diagnosis: Adenocarcinoma of upper lobe of left lung (CMS-HCC) (Adenocarcinoma of upper lobe of left lung (CMS-HCC) [C34.12])  Discharge Diagnosis: s/p Left Robotic Assisted Thoracoscopic, Left Upper Lobectomy, Left Regional & Mediastinal Lymph Node Dissection, Multi-Level Intercostal Nerve Block, Flexible Bronchoscop  Has there been a discharge within the last 30 days? No NA  Hospital Services: Unplanned  Today's call is 1 (business) days post discharge      Discharge Instruction Review   Did patient receive and understand discharge instructions? Yes    Home Health ordered? No  Caregiver assistance in the home? No   Are there concerns regarding the patient's ADL'S? No  Is patient a fall risk? No    Special diet? No  Discharge issues and concerns: No barriers      Medication Reconciliation    Changes to pre-hospital medications? Yes  START taking:  acetaminophen (TYLENOL)  gabapentin  (NEURONTIN )  Start taking on: February 09, 2024  methocarbamol  (ROBAXIN )  oxyCODONE  (ROXICODONE )  polyethylene glycol 3350 (MIRALAX)  senna (SENNA LAXATIVE)  Were new prescriptions filled? Yes  Meds reviewed and reconciled? Yes    Current Outpatient Medications   Medication Instructions    acetaminophen (TYLENOL) 325-650 mg, Oral, EVERY  6 HOURS PRN, Do not take with alcohol    atenolol  (TENORMIN ) 25 mg, Oral, DAILY    atorvastatin  (LIPITOR) 80 mg, Oral, DAILY    chlorthalidone  (HYGROTON ) 25 mg, Oral, DAILY    gabapentin  (NEURONTIN ) 300 mg capsule Take one capsule by mouth every 8 hours for 5 days, THEN one capsule twice daily for 5 days, THEN one capsule daily for 5 days. Indications: acute pain following an operation    levothyroxine  (SYNTHROID ) 125 mcg, Oral, DAILY 30MIN BEFORE BREAKFAST    methocarbamol  (ROBAXIN ) 750 mg, Oral, THREE TIMES DAILY PRN    nicotine (NICODERM CQ) 21 mg/day patch 1 patch, EVERY 24 HOURS PRN    other medication 1 Dose, Topical, DAILY, Medication Name & Strength: Testosterone  Gel 2%  Apply 4 clicks (1ML) topically daily    oxyCODONE  (ROXICODONE ) 5-10 mg, Oral, EVERY  6 HOURS PRN    polyethylene glycol 3350 (MIRALAX) 17 g, Oral, TWICE DAILY    senna (SENNA LAXATIVE) 8.6 mg tablet 2 tablets, Oral, TWICE DAILY    sildenafil (VIAGRA) 25 mg, Oral, AS NEEDED, Max of 1 tablet per day.    venlafaxine  XR (EFFEXOR  XR) 150 mg, Oral, DAILY WITH FOOD       Understanding Condition   Having any current symptoms? Yes, Pain/discomfortStatest hat is a little sore post procedure, but that pain meds are helping to relieve pain.     Do you have a history of Heart Failure? No    Patient understands when to seek additional medical care? Yes   Other items discussed: Pt wanted this RN to send a message to PCP office  to get Testerone gel filled, pt states that has been out for over a month update as to why this med has not been refilled.Pt states that  Pharmacy called [facility] in [place], [place] has been trying to contact PCP office with no response to get med refilled .  Scheduling Follow-up Appointment   Upcoming appointments:   Future Appointments   Date Time Provider Department Center   02/22/2024  3:00 PM Howell Alm [facility]   02/23/2024 12:00 PM Wilkerson, Jordan A, MD [id number]   09/28/2024  2:00 PM Gemma Hila, MD [facility]     Does the patient require 7 day follow up appointment? No  Hospital Follow-Up scheduled with PCP? No, Pt has f/u with oncology and CTS does not feel needs to see PCP at this time  When was patient?s last PCP visit: 09/29/2023   PCP primary location: [facility]  Specialist appointment scheduled? Yes, with CTS 02/22/24, 02/23/24  Is assistance with transportation needed? No   MyChart message sent? Active in MyChart. No message sent.   [facility] text sent? No    Edsel Schmitz, RN

## 2024-02-10 NOTE — Telephone Encounter [36]
 Thank you for the information and it was declined because it was never filled before through our office.  Need more information and where and how long he has been on that medication.  MV

## 2024-02-16 LAB — SURGICAL PATHOLOGY
~~LOC~~ BKR BLOCK FOR FURTHER TESTING: 0
~~LOC~~ BKR DIAGNOSIS COMMENT: 62
